# Patient Record
Sex: Male | Born: 1935 | Race: White | Hispanic: No | Marital: Married | Smoking: Never smoker
Health system: Southern US, Community
[De-identification: ages and names within clinical notes are randomized; demographics above are authoritative.]

## PROBLEM LIST (undated history)

## (undated) DIAGNOSIS — H3530 Unspecified macular degeneration: Secondary | ICD-10-CM

## (undated) DIAGNOSIS — R361 Hematospermia: Secondary | ICD-10-CM

## (undated) DIAGNOSIS — N312 Flaccid neuropathic bladder, not elsewhere classified: Secondary | ICD-10-CM

## (undated) DIAGNOSIS — G4700 Insomnia, unspecified: Secondary | ICD-10-CM

## (undated) DIAGNOSIS — N419 Inflammatory disease of prostate, unspecified: Secondary | ICD-10-CM

## (undated) DIAGNOSIS — F419 Anxiety disorder, unspecified: Secondary | ICD-10-CM

## (undated) DIAGNOSIS — N452 Orchitis: Secondary | ICD-10-CM

## (undated) DIAGNOSIS — F4320 Adjustment disorder, unspecified: Secondary | ICD-10-CM

## (undated) DIAGNOSIS — H9319 Tinnitus, unspecified ear: Secondary | ICD-10-CM

## (undated) DIAGNOSIS — K5900 Constipation, unspecified: Secondary | ICD-10-CM

## (undated) DIAGNOSIS — Z87442 Personal history of urinary calculi: Secondary | ICD-10-CM

## (undated) DIAGNOSIS — L80 Vitiligo: Secondary | ICD-10-CM

## (undated) DIAGNOSIS — J45909 Unspecified asthma, uncomplicated: Secondary | ICD-10-CM

## (undated) DIAGNOSIS — N323 Diverticulum of bladder: Secondary | ICD-10-CM

## (undated) DIAGNOSIS — J309 Allergic rhinitis, unspecified: Secondary | ICD-10-CM

## (undated) DIAGNOSIS — E7800 Pure hypercholesterolemia, unspecified: Secondary | ICD-10-CM

## (undated) DIAGNOSIS — I480 Paroxysmal atrial fibrillation: Secondary | ICD-10-CM

## (undated) DIAGNOSIS — K5730 Diverticulosis of large intestine without perforation or abscess without bleeding: Secondary | ICD-10-CM

## (undated) DIAGNOSIS — K649 Unspecified hemorrhoids: Secondary | ICD-10-CM

## (undated) HISTORY — DX: Pure hypercholesterolemia, unspecified: E78.00

## (undated) HISTORY — DX: Tinnitus, unspecified ear: H93.19

## (undated) HISTORY — DX: Unspecified macular degeneration: H35.30

## (undated) HISTORY — DX: Flaccid neuropathic bladder, not elsewhere classified: N31.2

## (undated) HISTORY — DX: Unspecified hemorrhoids: K64.9

## (undated) HISTORY — DX: Vitiligo: L80

## (undated) HISTORY — DX: Personal history of urinary calculi: Z87.442

## (undated) HISTORY — DX: Adjustment disorder, unspecified: F43.20

## (undated) HISTORY — DX: Constipation, unspecified: K59.00

## (undated) HISTORY — DX: Anxiety disorder, unspecified: F41.9

## (undated) HISTORY — DX: Inflammatory disease of prostate, unspecified: N41.9

## (undated) HISTORY — DX: Orchitis: N45.2

## (undated) HISTORY — DX: Diverticulosis of large intestine without perforation or abscess without bleeding: K57.30

## (undated) HISTORY — DX: Unspecified asthma, uncomplicated: J45.909

## (undated) HISTORY — DX: Insomnia, unspecified: G47.00

## (undated) HISTORY — DX: Allergic rhinitis, unspecified: J30.9

## (undated) HISTORY — DX: Hematospermia: R36.1

## (undated) HISTORY — DX: Paroxysmal atrial fibrillation: I48.0

## (undated) HISTORY — DX: Diverticulum of bladder: N32.3

---

## 1998-03-18 ENCOUNTER — Other Ambulatory Visit: Admission: RE | Admit: 1998-03-18 | Discharge: 1998-03-18 | Payer: Self-pay | Admitting: Internal Medicine

## 2000-03-24 ENCOUNTER — Encounter: Payer: Self-pay | Admitting: Internal Medicine

## 2000-03-24 ENCOUNTER — Emergency Department (HOSPITAL_COMMUNITY): Admission: EM | Admit: 2000-03-24 | Discharge: 2000-03-24 | Payer: Self-pay | Admitting: Emergency Medicine

## 2000-03-27 ENCOUNTER — Encounter: Admission: RE | Admit: 2000-03-27 | Discharge: 2000-03-27 | Payer: Self-pay | Admitting: Urology

## 2000-03-27 ENCOUNTER — Encounter: Payer: Self-pay | Admitting: Urology

## 2000-03-30 ENCOUNTER — Encounter: Payer: Self-pay | Admitting: Urology

## 2000-03-30 ENCOUNTER — Ambulatory Visit (HOSPITAL_COMMUNITY): Admission: RE | Admit: 2000-03-30 | Discharge: 2000-03-30 | Payer: Self-pay | Admitting: Urology

## 2000-04-11 ENCOUNTER — Encounter: Admission: RE | Admit: 2000-04-11 | Discharge: 2000-04-11 | Payer: Self-pay | Admitting: Urology

## 2000-04-11 ENCOUNTER — Encounter: Payer: Self-pay | Admitting: Urology

## 2000-04-17 ENCOUNTER — Encounter: Payer: Self-pay | Admitting: Urology

## 2000-04-17 ENCOUNTER — Ambulatory Visit (HOSPITAL_COMMUNITY): Admission: RE | Admit: 2000-04-17 | Discharge: 2000-04-17 | Payer: Self-pay | Admitting: Urology

## 2000-04-26 ENCOUNTER — Encounter: Payer: Self-pay | Admitting: Urology

## 2000-04-26 ENCOUNTER — Encounter: Admission: RE | Admit: 2000-04-26 | Discharge: 2000-04-26 | Payer: Self-pay | Admitting: Urology

## 2000-05-02 ENCOUNTER — Encounter: Payer: Self-pay | Admitting: Urology

## 2000-05-02 ENCOUNTER — Encounter: Admission: RE | Admit: 2000-05-02 | Discharge: 2000-05-02 | Payer: Self-pay | Admitting: Urology

## 2000-05-15 ENCOUNTER — Ambulatory Visit (HOSPITAL_COMMUNITY): Admission: RE | Admit: 2000-05-15 | Discharge: 2000-05-15 | Payer: Self-pay | Admitting: Urology

## 2000-05-22 ENCOUNTER — Encounter: Payer: Self-pay | Admitting: Urology

## 2000-05-22 ENCOUNTER — Encounter: Admission: RE | Admit: 2000-05-22 | Discharge: 2000-05-22 | Payer: Self-pay | Admitting: Urology

## 2001-02-15 ENCOUNTER — Encounter (INDEPENDENT_AMBULATORY_CARE_PROVIDER_SITE_OTHER): Payer: Self-pay | Admitting: Specialist

## 2001-02-15 ENCOUNTER — Inpatient Hospital Stay (HOSPITAL_COMMUNITY): Admission: RE | Admit: 2001-02-15 | Discharge: 2001-02-16 | Payer: Self-pay | Admitting: Urology

## 2002-07-02 ENCOUNTER — Encounter: Payer: Self-pay | Admitting: Interventional Cardiology

## 2002-07-02 ENCOUNTER — Ambulatory Visit (HOSPITAL_COMMUNITY): Admission: RE | Admit: 2002-07-02 | Discharge: 2002-07-02 | Payer: Self-pay | Admitting: Internal Medicine

## 2002-12-05 HISTORY — PX: PROSTATE SURGERY: SHX751

## 2009-02-18 ENCOUNTER — Encounter
Admission: RE | Admit: 2009-02-18 | Discharge: 2009-03-03 | Payer: Self-pay | Admitting: Physical Medicine and Rehabilitation

## 2010-10-04 ENCOUNTER — Ambulatory Visit (HOSPITAL_BASED_OUTPATIENT_CLINIC_OR_DEPARTMENT_OTHER): Admission: RE | Admit: 2010-10-04 | Discharge: 2010-10-05 | Payer: Self-pay | Admitting: Urology

## 2011-02-16 LAB — POCT I-STAT 4, (NA,K, GLUC, HGB,HCT)
Glucose, Bld: 106 mg/dL — ABNORMAL HIGH (ref 70–99)
HCT: 45 % (ref 39.0–52.0)
Hemoglobin: 15.3 g/dL (ref 13.0–17.0)

## 2011-04-22 NOTE — Op Note (Signed)
[facility]  Patient:    Dylan Hartman, Dylan Hartman                        MRN: [id number] Proc. Date: 02/15/01 Attending:  Vonzell Schlatter. Patsi Sears, M.D. CC:         Pearla Dubonnet, M.D.   Operative Report  PREOPERATIVE DIAGNOSES:  Benign prostatic hypertrophy, prostatitis.  POSTOPERATIVE DIAGNOSES:  Benign prostatic hypertrophy, prostatitis, and probable prostatic infarction.  OPERATION PERFORMED:  Cystourethroscopy, bladder neck incision, transurethral resection of prostate.  SURGEON:  Dr. Patsi Sears.  ANESTHESIA:  General (LMA).  PREPARATION:  After appropriate preanesthesia, the patient was brought to the operating room, placed on the operating table in the dorsal supine position where general LMA anesthesia was introduced. He was then replaced in the low Allen stirrup dorsal lithotomy position where the pubis was prepped with Betadine solution and draped in the usual fashion.  DESCRIPTION OF PROCEDURE:  A bladder neck incision was made with the [facility] knife, and transurethral resection of the prostate was made with resection accomplished from the 11 oclock to the 6 oclock position, and from the 1 oclock to the ______ oclock position. It was apparent from the resection that there were areas of prostatic infarction. Resection was accomplished and bleeding was electrocoagulated, and a #24 two-way Ainsworth catheter was left in position. The patient tolerated the procedure well, and was awakened and taken to the recovery room in good condition. DD:  02/15/01 TD:  02/15/01 Job: [id number]

## 2011-04-22 NOTE — Cardiovascular Report (Signed)
NAME:  Dylan Hartman, Dylan Hartman                         ACCOUNT NO.:  [id number]   MEDICAL RECORD NO.:  [id number]                   PATIENT TYPE:  OIB   LOCATION:  2854                                 FACILITY:  [facility]   PHYSICIAN:  Lesleigh Noe, M.D.            DATE OF BIRTH:  1929/11/29   DATE OF PROCEDURE:  DATE OF DISCHARGE:  07/02/2002                              CARDIAC CATHETERIZATION   INDICATIONS FOR PROCEDURE:  Recently performed stress Cardiolite because of  recurring symptoms of chest burning revealed evidence of possible anterior  ischemia and a fixed inferior wall defect.  This study is being done to rule  out evidence of obstructive coronary artery disease.   PROCEDURES PERFORMED:  1. Left heart catheterization.  2. Selective coronary angiography.  3. Left ventriculography.   DESCRIPTION OF PROCEDURE:  After informed consent, a 6-french sheath was  inserted into the right femoral artery using the modified Seldinger  technique.  A 6-French __________ multipurpose catheter was used for  hemodynamic recordings, left and right coronary angiography, and left  ventriculography by hand injection.  The patient tolerated the procedure  without complications.  No complications occurred.   RESULTS:  A. Hemodynamic data:     a. Aortic pressure:  135/73.     b. Left ventricular pressure:  154/14  B. Left ventriculography:  Normal ejection fraction of 60%, no MR.  C. Coronary angiography.     a. Left main coronary:  Normal.     b. Left anterior descending:  The LAD was large and wrapped around the        left ventricular apex.  Gives origin to a large pair of diagonals that        were normal.  Irregularities were noted in the proximal and mid LAD,        but no high-grade obstruction was felt to be present.     c. Circumflex artery:  The circumflex artery gives origin to two obtuse        marginal branches.  The first is a dominant vessel.  The proximal        circumflex  contains irregularities of up to 20%, no high-grade        obstruction was noted.     d. Right coronary artery:  The right coronary artery is dominant.  It is        free of any significant obstruction.  There is a PDA and left        ventricular branch.   CONCLUSIONS:  1. Minimal luminal irregularities noted in the proximal LAD and circumflex.     No high-grade obstruction was noted.  2. Normal LV function.  3.     The patient does not have any evidence of significant coronary artery     disease or any anatomy that would suggest that his chest symptoms are     ischemia/coronary blood flow-related.  PLAN:  No further cardiac evaluation.                                                   Lesleigh Noe, M.D.    HWS/MEDQ  D:  07/02/2002  T:  07/07/2002  Job:  [id number]

## 2011-04-22 NOTE — Consult Note (Signed)
[place]  Patient:    Dylan Hartman, Dylan Hartman                        MRN: [id number] Proc. Date: 03/24/00 Adm. Date:  [id number] Attending:  Lorre Nick Dictator:   [id number]                          Consultation Report  EMERGENCY ROOM  DATE OF BIRTH: 1929/06/24  CHIEF COMPLAINT: Abdominal pain.  HISTORY OF PRESENT ILLNESS: Mr. Corpening is a very pleasant 77 year old male with the following medical problems:  1. History of renal calculus x 1 in the past.  2. Macular degeneration.  3. Allergic rhinitis, seasonal.  4. Vitiligo.  5. Paroxysmal atrial fibrillation.  6. Chronic sinusitis.  The patient awakened suddenly from sleep this morning complaining of a burning ain in his right lower quadrant which then radiated up to his umbilicus.  He had no  nausea or vomiting and no abdominal distention.  No urinary symptoms were noted and no recent change in bowel movements, and no current diarrhea.  The patient denied fever or chills.  There is no testicular pain.  He now states the pain has migrated slightly into his pelvic region anteriorly.  The pain is constant, with some increase or decrease at times.  PAST MEDICAL HISTORY: As above.  CURRENT MEDICATIONS:  1. The patient took several Tums tonight.  2. Augmentin tablet x 1 last p.m.  3. Claritin 10 mg q.d. p.r.n.  4. Lanoxin 0.25 mg q.d.  5. Baby aspirin 1 q.d.  FAMILY HISTORY: Significant for vitiligo and macular degeneration.  SOCIAL HISTORY: The patient is married and his wife is present with him currently. No tobacco or alcohol use.  PHYSICAL EXAMINATION:  GENERAL:  Well-developed, well-nourished male complaining of pain as noted above.  VITAL SIGNS:  Temperature 97.2 degrees, pulse 58 and regular, respiratory rate 0, blood pressure 166/75 in the right arm supine.  HEENT: Decreased vision in both eyes bilaterally.  EOMI.  Oropharynx clear.  NECK: Supple without  JVD.  CHEST: Clear to auscultation.  CARDIAC: Regular rate and rhythm without murmurs, rubs, or gallops.  ABDOMEN: Soft with mild tenderness to deep palpation in the right lower quadrant. No rebound.  Bowel sounds are somewhat decreased but present.  Abdomen nondistended.  No masses felt.  RECTAL: Nontender prostate, non-nodular.  Stool is brown and Hemoccult negative. Good anal sphincter tone.  GU: Genitalia within normal limits.  EXTREMITIES: Without clubbing, cyanosis, or edema.  NEUROLOGIC: Nonfocal.  SKIN: Without significant changes.  LABORATORY DATA: Three-way abdominal film is normal except for increased amount of stool and a 5 x 7 mm renal calculus noted in the mid right ureter.  Urinalysis is yet to be collected as the patient is not urinating as yet, but this will be sent.  CBC revealed a WBC of 5900, hemoglobin 15.8, platelet count 201,000; 55% neutrophils, 30% lymphocytes, 5% eosinophils.  Sodium 137, potassium 3.9, chloride 101, carbon dioxide 28, glucose 105, BUN 20, creatinine 1.1.  Calcium 9.1, total protein 6.8, albumin 3.6, SGOT 34, SGPT 22,  alkaline phosphatase 48, bilirubin 0.4.  MEDICATIONS GIVEN IN EMERGENCY ROOM:  1. Demerol 50 mg IV with 12.5 mg of Phenergan.  2. This was followed by 30 mg IV of Toradol after it was discovered that     he had a renal calculus.  3. Pepcid 20 mg IV.  Of note is that five to ten minutes after giving IV Toradol his pain essentially resolved.  ASSESSMENT: Right ureter renal calculus - with pain resolved after Toradol 30 mg intravenous dose, which followed intravenous Demerol.  PLAN:  1. Strain all urine.  2. Discharge home after his second liter of intravenous fluids is infused and     urinalysis has been sent.  3. Toradol 10 mg p.o. q.8h p.r.n. pain (five doses given by prescription).  4. If pain is not relieved within the next 24 hours or if it acutely worsens     the patient should contact a  physician in our office.  5. He has seen Dr. Etta Grandchild in the past.  He may need a second evaluation if pain     continues more than 24 hours and could need emergent extraction of the     stone, though I feel it is probably passing currently. DD:  03/23/00 TD:  03/24/00 Job: [id number]

## 2011-04-22 NOTE — Discharge Summary (Signed)
[facility]  Patient:    Dylan Hartman, Dylan Hartman                      MRN: [id number] Proc. Date: 02/15/01 Adm. Date:  [id number] Disc. Date: [id number] Attending:  Laqueta Jean                           Discharge Summary  23 Hour observation  PREOPERATIVE DIAGNOSES:  Benign prostatic hypertrophy.  POSTOPERATIVE DIAGNOSES:  Benign prostatic hypertrophy.  OPERATION PERFORMED:  Transurethral resection of prostate by Dr. Patsi Sears under general (LMA) anesthesia.  HISTORY OF PRESENT ILLNESS:  Dylan Hartman is a 77 year old male, treated for chronic prostatitis and bladder outlet obstruction symptoms for transurethral resection of the prostate. The patient has been treated with and failed multiple medications, including alpha blockers and antibiotics. Prostate ultrasound showed a 1.2 cm prostatic cyst, but no evidence of abscess. He is now being admitted via the operating room for TURP.  PAST MEDICAL HISTORY:  Noncontributory except for prostatitis.  REVIEW OF SYSTEMS:  Noncontributory.  ALCOHOL/TOBACCO:  None.  ALLERGIES:  SULFA.  PAST MEDICAL HISTORY: Atrial fibrillation status post cardiolite May 08, 2000, with 68% ejection fraction. He has macular degeneration, is legally blind.  PHYSICAL EXAMINATION:  GENERAL:  Shows a well-developed, well-nourished, white male in no acute distress.  HEENT:  PERRL. EOM full.  NECK:  Supple, nontender, no nodes.  CHEST:  Clear to auscultation and percussion.  ABDOMEN:  Soft, positive bowel sounds without organomegaly or masses.  GENITALIA:  Normal male external genitalia. Testicles descended bilaterally, no abdominal wall hernia. The vas and epididymis were normal. The urethra was normal. The meatus is normal.  RECTAL:  Shows a normal sphincter tone and prostate, 3+ lobular, benign with no blood.  ADMISSION LABORATORY DATA:  Shows EKG with heart rate of 61. Serum creatinine 1.3, BUN 18, hemoglobin  14.2, hematocrit 43.2.  HOSPITAL COURSE:  On the date of admission, the patient underwent TURP. He did quite well, and note that a bladder stone was identified and extracted. The patients had had a bladder stone extracted previously. A Foley catheter was inserted, the patient did well, and was allowed to be discharged on the first postoperative day with Foley catheter in place to be removed in the office. He is allowed to be discharged in stable condition. DD:  02/26/01 TD:  02/26/01 Job: [id number]

## 2011-08-30 ENCOUNTER — Encounter (INDEPENDENT_AMBULATORY_CARE_PROVIDER_SITE_OTHER): Payer: Self-pay | Admitting: Ophthalmology

## 2011-09-16 ENCOUNTER — Encounter (INDEPENDENT_AMBULATORY_CARE_PROVIDER_SITE_OTHER): Payer: Medicare Other | Admitting: Ophthalmology

## 2011-09-16 DIAGNOSIS — H2510 Age-related nuclear cataract, unspecified eye: Secondary | ICD-10-CM

## 2011-09-16 DIAGNOSIS — H3530 Unspecified macular degeneration: Secondary | ICD-10-CM

## 2011-09-16 DIAGNOSIS — H43819 Vitreous degeneration, unspecified eye: Secondary | ICD-10-CM

## 2012-05-05 DIAGNOSIS — N452 Orchitis: Secondary | ICD-10-CM

## 2012-05-05 HISTORY — DX: Orchitis: N45.2

## 2012-09-17 ENCOUNTER — Encounter (INDEPENDENT_AMBULATORY_CARE_PROVIDER_SITE_OTHER): Payer: Medicare Other | Admitting: Ophthalmology

## 2012-09-17 DIAGNOSIS — H2510 Age-related nuclear cataract, unspecified eye: Secondary | ICD-10-CM

## 2012-09-17 DIAGNOSIS — H43819 Vitreous degeneration, unspecified eye: Secondary | ICD-10-CM

## 2012-09-17 DIAGNOSIS — H3530 Unspecified macular degeneration: Secondary | ICD-10-CM

## 2013-09-23 ENCOUNTER — Ambulatory Visit (INDEPENDENT_AMBULATORY_CARE_PROVIDER_SITE_OTHER): Payer: Medicare Other | Admitting: Ophthalmology

## 2013-10-16 ENCOUNTER — Ambulatory Visit (INDEPENDENT_AMBULATORY_CARE_PROVIDER_SITE_OTHER): Payer: Medicare Other | Admitting: Ophthalmology

## 2013-10-16 DIAGNOSIS — H2510 Age-related nuclear cataract, unspecified eye: Secondary | ICD-10-CM

## 2013-10-16 DIAGNOSIS — H43819 Vitreous degeneration, unspecified eye: Secondary | ICD-10-CM

## 2013-10-16 DIAGNOSIS — H35059 Retinal neovascularization, unspecified, unspecified eye: Secondary | ICD-10-CM

## 2013-10-16 DIAGNOSIS — H3530 Unspecified macular degeneration: Secondary | ICD-10-CM

## 2013-11-18 ENCOUNTER — Encounter (INDEPENDENT_AMBULATORY_CARE_PROVIDER_SITE_OTHER): Payer: Medicare Other | Admitting: Ophthalmology

## 2013-11-18 DIAGNOSIS — H35059 Retinal neovascularization, unspecified, unspecified eye: Secondary | ICD-10-CM

## 2013-12-30 ENCOUNTER — Encounter (INDEPENDENT_AMBULATORY_CARE_PROVIDER_SITE_OTHER): Payer: Medicare Other | Admitting: Ophthalmology

## 2013-12-30 DIAGNOSIS — H35059 Retinal neovascularization, unspecified, unspecified eye: Secondary | ICD-10-CM

## 2014-03-03 ENCOUNTER — Encounter (INDEPENDENT_AMBULATORY_CARE_PROVIDER_SITE_OTHER): Payer: Medicare Other | Admitting: Ophthalmology

## 2014-03-03 DIAGNOSIS — H43819 Vitreous degeneration, unspecified eye: Secondary | ICD-10-CM

## 2014-03-03 DIAGNOSIS — H35059 Retinal neovascularization, unspecified, unspecified eye: Secondary | ICD-10-CM

## 2014-03-03 DIAGNOSIS — H3530 Unspecified macular degeneration: Secondary | ICD-10-CM

## 2014-03-03 DIAGNOSIS — H2510 Age-related nuclear cataract, unspecified eye: Secondary | ICD-10-CM

## 2014-06-05 ENCOUNTER — Encounter (INDEPENDENT_AMBULATORY_CARE_PROVIDER_SITE_OTHER): Payer: Medicare Other | Admitting: Ophthalmology

## 2014-06-05 DIAGNOSIS — H3530 Unspecified macular degeneration: Secondary | ICD-10-CM

## 2014-06-05 DIAGNOSIS — H43819 Vitreous degeneration, unspecified eye: Secondary | ICD-10-CM

## 2014-06-05 DIAGNOSIS — H2510 Age-related nuclear cataract, unspecified eye: Secondary | ICD-10-CM

## 2014-09-01 ENCOUNTER — Ambulatory Visit (INDEPENDENT_AMBULATORY_CARE_PROVIDER_SITE_OTHER): Payer: Medicare Other | Admitting: Ophthalmology

## 2014-12-15 ENCOUNTER — Ambulatory Visit (INDEPENDENT_AMBULATORY_CARE_PROVIDER_SITE_OTHER): Payer: Medicare Other | Admitting: Ophthalmology

## 2014-12-15 DIAGNOSIS — H2513 Age-related nuclear cataract, bilateral: Secondary | ICD-10-CM

## 2014-12-15 DIAGNOSIS — H43813 Vitreous degeneration, bilateral: Secondary | ICD-10-CM

## 2014-12-15 DIAGNOSIS — H3531 Nonexudative age-related macular degeneration: Secondary | ICD-10-CM

## 2014-12-31 ENCOUNTER — Encounter: Payer: Self-pay | Admitting: Neurology

## 2015-01-02 ENCOUNTER — Ambulatory Visit (INDEPENDENT_AMBULATORY_CARE_PROVIDER_SITE_OTHER): Payer: Medicare Other | Admitting: Neurology

## 2015-01-02 ENCOUNTER — Encounter: Payer: Self-pay | Admitting: Neurology

## 2015-01-02 VITALS — BP 119/63 | HR 66 | Temp 97.1°F | Ht 72.0 in | Wt 194.0 lb

## 2015-01-02 DIAGNOSIS — G20 Parkinson's disease: Secondary | ICD-10-CM

## 2015-01-02 NOTE — Patient Instructions (Signed)
You have mild parkinsonism. Your exam is reassuring. Overall you are doing fairly well but I do want to suggest a few things today:  Remember to drink plenty of fluid, eat healthy meals and do not skip any meals. Try to eat protein with a every meal and eat a healthy snack such as fruit or nuts in between meals. Try to keep a regular sleep-wake schedule and try to exercise daily, particularly in the form of walking, 20-30 minutes a day, if you can.   Try to stay active physically and mentally. Engage in social activities in your community and with your family and try to keep up with current events by reading the newspaper or watching the news. Try to do word puzzles and you may like to do word puzzles and brain games on the computer such as on [email or web address].   As far as your medications are concerned, I would like to suggest that you take your current medication with the following additional changes: no new medications for now.    I would like to see you back in 4 months, sooner if we need to. Please call us with any interim questions, concerns, problems, updates or refill requests.  Our phone number is [phone]. We also have an after hours call service for urgent matters and there is a physician on-call for urgent questions, that cannot wait till the next work day. For any emergencies you know to call 911 or go to the nearest emergency room.

## 2015-01-02 NOTE — Progress Notes (Signed)
Subjective:    Patient ID: Dylan Hartman is a 81 y.o. male.  HPI     Huston FoleySaima Aaryan Essman, MD, PhD [facility] [address], [address] [address] [place], [place] [address]  Dear Dr. Kevan NyGates,  I saw your patient, Dylan Hartman, upon your kind request in my neurologic clinic today for initial consultation of his parkinsonism. The patient is accompanied by his wife today. As you know, Dylan Hartman is a very pleasant 81 year old right-handed gentleman with an underlying complex medical history of allergic rhinitis, legal blindness secondary to severe macular degeneration, sigmoid diverticulosis, hyperlipidemia, vitiligo, renal stones, insomnia, anxiety, recurrent prostatitis, paroxysmal atrial fibrillation, and BPH, who reports problems with slowness and stiffness over the past year or so. He was noted to have some parkinsonian symptoms by you. You prescribed Sinemet for him which he took with some improvement in his symptoms, however he had GI side effects including abdominal pain and bloating. He is not particularly bothered by his symptoms. He is not impaired in his activities of daily functioning. He has had no recent falls. He does not use an aid to walk. He tries to stay active. He has not had much in the way of memory loss, hallucinations, sleep disorder, personality changes, and reports no tremors.   His Past Medical History Is Significant For: Past Medical History  Diagnosis Date  . Paroxysmal atrial fibrillation   . Adjustment disorder     situational  . Allergic rhinitis   . Degeneration macular     blindness  . Hypercholesterolemia   . Sigmoid diverticulosis   . Vitiligo   . Asthmatic bronchitis     episodic  . H/O renal calculi   . Diverticulum of bladder     Dr Retta Dionesahlstedt  . Hematospermia   . Insomnia   . Anxiety   . Prostatitis     episodic, last episode 12/1999  . Constipation     chronic mild  . Tinnitus   . Hemorrhoids     internal , 12/2009  .  Atonic bladder     in and out  . Orchitis 05/2012    His Past Surgical History Is Significant For: Past Surgical History  Procedure Laterality Date  . Prostate surgery  2004    TUR    His Family History Is Significant For: Family History  Problem Relation Age of Onset  . Stroke Mother   . Prostate cancer Father   . Heart Problems Father   . Macular degeneration Brother     His Social History Is Significant For: History   Social History  . Marital Status: Married    Spouse Name: [name]    Number of Children: 2  . Years of Education: 16   Occupational History  .      retired   Social History Main Topics  . Smoking status: Never Smoker   . Smokeless tobacco: Never Used  . Alcohol Use: No  . Drug Use: No  . Sexual Activity: None   Other Topics Concern  . None   Social History Narrative   Consumes one cup of caffeine daily    His Allergies Are:  Allergies  Allergen Reactions  . Other     Bluefish,tomatoes(large amounts)  . Peanuts [Peanut Oil]   . Sinemet [Carbidopa W-Levodopa]     Upset stomach  . Sulfa Antibiotics   :   His Current Medications Are:  Outpatient Encounter Prescriptions as of 01/02/2015  Medication Sig  . acetaminophen (  TYLENOL) 500 MG tablet Take 500 mg by mouth 2 (two) times daily. Extra strength,2 tablets  Twice daily  . aspirin 81 MG chewable tablet Chew by mouth daily. Two tablets Monday and Friday  . cholecalciferol (VITAMIN D) 1000 UNITS tablet Take 1,000 Units by mouth daily.  . clonazePAM (KLONOPIN) 1 MG tablet   . DIGOX 250 MCG tablet   . hydrocortisone (ANUSOL-HC) 25 MG suppository Place 25 mg rectally 2 (two) times daily.  Marland Kitchen loratadine (CLARITIN) 10 MG tablet Take 10 mg by mouth daily as needed for allergies.  . multivitamin-lutein (OCUVITE-LUTEIN) CAPS capsule Take 1 capsule by mouth 2 (two) times daily.  . Omega-3 Fatty Acids (FISH OIL) 1200 MG CAPS Take by mouth daily.  . Polyethylene Glycol 3350 (MIRALAX PO) Take by mouth.  As directed  . rosuvastatin (CRESTOR) 5 MG tablet Take 5 mg by mouth daily.  . shark liver oil-cocoa butter (PREPARATION H) 0.25-3-85.5 % suppository Place 1 suppository rectally as needed for hemorrhoids.  Marland Kitchen venlafaxine XR (EFFEXOR-XR) 75 MG 24 hr capsule   . [DISCONTINUED] Carbidopa-Levodopa ER (SINEMET CR) 25-100 MG tablet controlled release   :   Review of Systems:  Out of a complete 14 point review of systems, all are reviewed and negative with the exception of these symptoms as listed below:   Review of Systems  Eyes:       Legally blind  Gastrointestinal:       Incontinence, constipation  Neurological:       Memory loss, confusion    Objective:  Neurologic Exam  Physical Exam Physical Examination:   Filed Vitals:   01/02/15 1021  BP: 119/63  Pulse: 66  Temp: 97.1 F (36.2 C)    General Examination: The patient is a very pleasant 81 y.o. male in no acute distress. He appears well-developed and well-nourished and well groomed.   HEENT: Normocephalic, atraumatic, pupils are equal, round and reactive to light and accommodation. Funduscopic exam is difficult. Extraocular tracking is impaired and he has limitation to upgaze, downgaze and side to side gaze. He is visually impaired. He can see moving targets better and has some light perception. Hearing is grossly intact. Tympanic membranes are clear bilaterally. Face is symmetric with mild facial masking and normal facial sensation. Speech is clear with no dysarthria noted.  there may be a mild decrease in blink rate. There is  possibly mild hypophonia. There is no lip, neck/head, jaw or voice tremor. Neck is mildly rigid with full range of passive and active motion. There are no carotid bruits on auscultation. Oropharynx exam reveals: mild mouth dryness, adequate dental hygiene and mild airway crowding. Mallampati is class II. Tongue protrudes centrally and palate elevates symmetrically.    Chest: Clear to auscultation without  wheezing, rhonchi or crackles noted.  Heart: S1+S2+0, regular and normal without murmurs, rubs or gallops noted.   Abdomen: Soft, non-tender and non-distended with normal bowel sounds appreciated on auscultation.  Extremities: There is no pitting edema in the distal lower extremities bilaterally. Pedal pulses are intact.  Skin: Warm and dry without trophic changes noted. There are no varicose veins. Skin is hypopigmented.  Musculoskeletal: exam reveals no obvious joint deformities, tenderness or joint swelling or erythema.   Neurologically:  Mental status: The patient is awake, alert and oriented in all 4 spheres. His immediate and remote memory, attention, language skills and fund of knowledge are appropriate. There is no evidence of aphasia, agnosia, apraxia or anomia. Speech is clear with normal prosody and  enunciation, perhaps mildly hypophonic. Thought process is linear. Mood is normal and affect is normal.  Cranial nerves II - XII are as described above under HEENT exam. In addition: shoulder shrug is normal with equal shoulder height noted. Motor exam: Normal bulk, strength and tone is noted. There is no drift, tremor or rebound. Romberg is negative. Reflexes are 1+ throughout. Babinski: Toes are flexor bilaterally. Fine motor skills and coordination: He has mild fine motor difficulties in the bilateral upper and lower extremities with foot taps, foot agility, finger taps, rapid alternating movements and hand movements. There is no lateralization and findings are overall mild.   Cerebellar testing: No dysmetria or intention tremor on finger to nose testing. Heel to shin is unremarkable bilaterally. There is no truncal or gait ataxia.  Sensory exam: intact to light touch, pinprick, vibration, temperature sense in the upper and lower extremities.  Gait, station and balance: He stands with mild difficulty. Posture is mildly stooped but could be age-appropriate. Stance is slightly wide-based.  Gait shows mild decrease in stride length and pace and decrease in arm swing bilaterally. He turns in 3 steps and balance seems mildly impaired.               Assessment and Plan:   In summary, CONNELLY NETTERVILLE is a very pleasant 81 y.o.-year old male with an underlying complex medical history of allergic rhinitis, legal blindness secondary to severe macular degeneration, sigmoid diverticulosis, hyperlipidemia, vitiligo, renal stones, insomnia, anxiety, recurrent prostatitis, paroxysmal atrial fibrillation, and BPH, who and physical exam are in keeping with mild parkinsonism. He does not have on exam much in the way of tremors or lateralization of symptoms or signs. Overall findings are rather mild at this time. Thankfully he is still active and not impaired in his daily living and his functioning. His memory is age-appropriate, and at this time I talked to the patient and his wife about the diagnosis of parkinsonism and the fact that clinically he does not have telltale signs and symptoms of idiopathic Parkinson's disease at this time. He has tried Sinemet with side effects reported and at this time is not keen on trying any other medications. We will follow him clinically. Otherwise is neurological in general exam are nonfocal so we will hold off on any other testing at this time. They were in agreement. To that end, I would like to see him back in a few months for recheck and we will pick up our discussion about his symptoms and signs at the time and potential symptomatic treatment.  We talked about maintaining a healthy lifestyle in general. I encouraged the patient to eat healthy, exercise daily and keep well hydrated, to keep a scheduled bedtime and wake time routine, to not skip any meals and eat healthy snacks in between meals and to have protein with every meal. In particular, I stressed the importance of regular exercise, within of course the patient's own mobility limitations. I spent 45 min in total  face-to-face time with the patient, more 50% of which was spent in counseling and coordination of care, reviewing test results, reviewing medication and reviewing the diagnosis of parkinsonism, its prognosis and treatment options.  Thank you very much for allowing me to participate in the care of this nice patient. If I can be of any further assistance to you please do not hesitate to call me at [phone].  Sincerely,   Huston Foley, MD, PhD

## 2015-04-28 ENCOUNTER — Encounter: Payer: Self-pay | Admitting: Neurology

## 2015-04-28 ENCOUNTER — Ambulatory Visit (INDEPENDENT_AMBULATORY_CARE_PROVIDER_SITE_OTHER): Payer: Medicare Other | Admitting: Neurology

## 2015-04-28 VITALS — BP 124/60 | HR 60 | Resp 14 | Ht 72.0 in | Wt 192.0 lb

## 2015-04-28 DIAGNOSIS — G20 Parkinson's disease: Secondary | ICD-10-CM

## 2015-04-28 DIAGNOSIS — H540 Blindness, both eyes: Secondary | ICD-10-CM | POA: Diagnosis not present

## 2015-04-28 DIAGNOSIS — H543 Unqualified visual loss, both eyes: Secondary | ICD-10-CM

## 2015-04-28 NOTE — Progress Notes (Signed)
Subjective:    Patient ID: Dylan Hartman is a 81 y.o. male.  HPI     Interim history:   Dylan Hartman is a very pleasant 81 year old right-handed gentleman with an underlying complex medical history of allergic rhinitis, legal blindness secondary to severe macular degeneration, sigmoid diverticulosis, hyperlipidemia, vitiligo, renal stones, insomnia, anxiety, recurrent prostatitis, paroxysmal atrial fibrillation, and BPH, who presents for follow-up consultation of his parkinsonism. The patient is accompanied by his wife again today. I first met him on 01/02/2015 at the request of his primary care physician, at which time the patient reported in over one year history of slowness and stiffness. I felt he had mild parkinsonism but not much in the way of lateralization. He had stopped taking his Sinemet due to side effects. I suggested clinically monitoring him and rechecking in a few months as far as his symptoms and clinical exam.  Today, 04/28/2015: He reports feeling stable. He has not fallen recently. He and his wife reside at [facility] in the independent living. He likes to have dessert every day. He may not be drinking enough water. He averages about 4 glasses per day they estimate. His wife feels that his memory is not as good. He's forgetful. He sometimes misplaces things. She feels that sometimes he gets confused. She is not overly concerned but has noted some changes in his cognitive function. They have 2 grown daughters, 56 yo (in [place]) and 80 yo (local). They have 2 great grandchildren and one on the way. They go to the exercise room at [facility] about 3 times a week. They do chair yoga. He walks some but not daily.  Previously:  He was noted to have parkinsonism by his primary care physician, who started him on Sinemet. He noted some improvement in his symptoms, however he had GI side effects including abdominal pain and bloating. He is not particularly bothered by his symptoms. He is  not impaired in his activities of daily functioning. He has had no recent falls. He does not use an aid to walk. He tries to stay active. He has not had much in the way of memory loss, hallucinations, sleep disorder, personality changes, and reports no tremors.  His Past Medical History Is Significant For: Past Medical History  Diagnosis Date  . Paroxysmal atrial fibrillation   . Adjustment disorder     situational  . Allergic rhinitis   . Degeneration macular     blindness  . Hypercholesterolemia   . Sigmoid diverticulosis   . Vitiligo   . Asthmatic bronchitis     episodic  . H/O renal calculi   . Diverticulum of bladder     Dr Diona Fanti  . Hematospermia   . Insomnia   . Anxiety   . Prostatitis     episodic, last episode 12/1999  . Constipation     chronic mild  . Tinnitus   . Hemorrhoids     internal , 12/2009  . Atonic bladder     in and out  . Orchitis 05/2012  . Macular degeneration     His Past Surgical History Is Significant For: Past Surgical History  Procedure Laterality Date  . Prostate surgery  2004    [facility]    His Family History Is Significant For: Family History  Problem Relation Age of Onset  . Stroke Mother   . Prostate cancer Father   . Heart Problems Father   . Macular degeneration Brother     His Social History  Is Significant For: History   Social History  . Marital Status: Married    Spouse Name: Clarise Cruz  . Number of Children: 2  . Years of Education: 16   Occupational History  . Reired      retired   Social History Main Topics  . Smoking status: Never Smoker   . Smokeless tobacco: Never Used  . Alcohol Use: No  . Drug Use: No  . Sexual Activity: Not on file   Other Topics Concern  . None   Social History Narrative   Consumes one cup of caffeine daily    His Allergies Are:  Allergies  Allergen Reactions  . Other     Bluefish,tomatoes(large amounts)  . Peanuts [Peanut Oil]   . Sinemet [Carbidopa W-Levodopa]     Upset  stomach  . Sulfa Antibiotics   :   His Current Medications Are:  Outpatient Encounter Prescriptions as of 04/28/2015  Medication Sig  . acetaminophen (TYLENOL) 500 MG tablet Take 500 mg by mouth 2 (two) times daily. Extra strength,2 tablets  Twice daily  . aspirin 81 MG chewable tablet Chew by mouth daily. Two tablets Monday and Friday  . cholecalciferol (VITAMIN D) 1000 UNITS tablet Take 1,000 Units by mouth daily.  . clonazePAM (KLONOPIN) 1 MG tablet   . CRESTOR 20 MG tablet   . DIGOX 250 MCG tablet   . loratadine (CLARITIN) 10 MG tablet Take 10 mg by mouth daily as needed for allergies.  . multivitamin-lutein (OCUVITE-LUTEIN) CAPS capsule Take 1 capsule by mouth 2 (two) times daily.  . Omega-3 Fatty Acids (FISH OIL) 1200 MG CAPS Take by mouth daily.  . Polyethylene Glycol 3350 (MIRALAX PO) Take by mouth. As directed  . venlafaxine XR (EFFEXOR-XR) 75 MG 24 hr capsule   . [DISCONTINUED] hydrocortisone (ANUSOL-HC) 25 MG suppository Place 25 mg rectally 2 (two) times daily.  . [DISCONTINUED] rosuvastatin (CRESTOR) 5 MG tablet Take 5 mg by mouth daily.  . [DISCONTINUED] shark liver oil-cocoa butter (PREPARATION H) 0.25-3-85.5 % suppository Place 1 suppository rectally as needed for hemorrhoids.   No facility-administered encounter medications on file as of 04/28/2015.  :  Review of Systems:  Out of a complete 14 point review of systems, all are reviewed and negative with the exception of these symptoms as listed below:   Review of Systems  All other systems reviewed and are negative.   Objective:  Neurologic Exam  Physical Exam Physical Examination:   Filed Vitals:   04/28/15 1037  BP: 124/60  Pulse: 60  Resp: 14   General Examination: The patient is a very pleasant 81 y.o. male in no acute distress. He appears well-developed and well-nourished and well groomed.   HEENT: Normocephalic, atraumatic, pupils are equal, round and reactive to light and accommodation. Funduscopic  exam is difficult. Extraocular tracking is impaired and he has limitation to upgaze, downgaze and side to side gaze. His left eye does not move as well. He is visually impaired with absence of central vision secondary to macular degeneration. He can see moving targets better and has some light perception. Hearing is grossly intact. Face is symmetric with mild facial masking and normal facial sensation. Speech is clear with no dysarthria noted.  there may be a mild decrease in blink rate. There is  possibly mild hypophonia. There is no lip, neck/head, jaw or voice tremor. Neck is mildly rigid with full range of passive and active motion. There are no carotid bruits on auscultation. Oropharynx exam reveals: mild  mouth dryness, adequate dental hygiene and mild airway crowding. Mallampati is class II. Tongue protrudes centrally and palate elevates symmetrically.    Chest: Clear to auscultation without wheezing, rhonchi or crackles noted.  Heart: S1+S2+0, regular and normal without murmurs, rubs or gallops noted.   Abdomen: Soft, non-tender and non-distended with normal bowel sounds appreciated on auscultation.  Extremities: There is no pitting edema in the distal lower extremities bilaterally. Pedal pulses are intact.  Skin: Warm and dry without trophic changes noted. There are no varicose veins. Skin is hypopigmented.  Musculoskeletal: exam reveals no obvious joint deformities, tenderness or joint swelling or erythema.   Neurologically:  Mental status: The patient is awake, alert and oriented in all 4 spheres. His immediate and remote memory, attention, language skills and fund of knowledge are appropriate. There is no evidence of aphasia, agnosia, apraxia or anomia. Speech is clear with normal prosody and enunciation, perhaps mildly hypophonic. Thought process is linear. Mood is normal and affect is normal.  Cranial nerves II - XII are as described above under HEENT exam. In addition: shoulder shrug is  normal with equal shoulder height noted. Motor exam: Normal bulk, strength and tone is noted. There is no drift, tremor or rebound. Romberg is negative. Reflexes are 1+ throughout. Babinski: Toes are flexor bilaterally. Fine motor skills and coordination: He has mild fine motor difficulties in the bilateral upper and lower extremities with foot taps, foot agility, finger taps, rapid alternating movements and hand movements. There is no lateralization and findings are overall mild.   Cerebellar testing: No dysmetria or intention tremor on finger to nose testing. Heel to shin is unremarkable bilaterally. There is no truncal or gait ataxia.  Sensory exam: intact to light touch, pinprick, vibration, temperature sense in the upper and lower extremities.  Gait, station and balance: He stands with mild difficulty. Posture is mildly stooped but could be age-appropriate, unchanged. Stance is slightly wide-based. Gait shows mild decrease in stride length and pace and mild decrease in arm swing bilaterally. He turns in 3 steps and balance seems mildly impaired.               Assessment and Plan:   In summary, Dylan Hartman is a very pleasant 81 year old male with an underlying complex medical history of allergic rhinitis, legal blindness secondary to severe macular degeneration, sigmoid diverticulosis, hyperlipidemia, vitiligo, renal stones, insomnia, anxiety, recurrent prostatitis, paroxysmal atrial fibrillation, and BPH, who presents for follow-up consultation of his mild parkinsonism. Again, his exam does not show any overt lateralization. Symptoms and physical exam are overall fairly stable. We will monitor his symptoms and his exam. His wife reports some memory loss. We will continue to monitor. This could be age-appropriate memory loss. He tried Sinemet in the recent past with side effects and no significant improvement. He is currently not on any antiparkinsonian medication and I suggested we continue to  follow him clinically.  Thankfully he is still active and not impaired in his daily living and his functioning. He has not fallen. He is advised to consider using a cane for safety, especially since he is visually impaired.  We again talked about maintaining a healthy lifestyle in general. I encouraged the patient to eat healthy, exercise daily and keep well hydrated, to keep a scheduled bedtime and wake time routine, to not skip any meals and eat healthy snacks in between meals and to have protein with every meal. In particular, I stressed the importance of regular exercise, within of  course the patient's own mobility limitations. I encouraged him to drink more water, approximately 6 glasses a day and I encouraged him to walk on a daily basis if possible. At this juncture, I will see him back in 6 months, sooner if the need arises. I answered all her questions today and the patient and his wife were in agreement. I spent 20 minutes in total face-to-face time with the patient, more than 50% of which was spent in counseling and coordination of care, reviewing test results, reviewing medication and discussing or reviewing the diagnosis of parkinsonism, the prognosis and treatment options.

## 2015-04-28 NOTE — Patient Instructions (Signed)
Please drink more water, around 6 glasses a day. Try to walk on a daily basis, 15-20 minutes daily.  Use a cane for safety as needed.  I will see you back in 6 months for a re-check.

## 2015-10-07 ENCOUNTER — Telehealth: Payer: Self-pay

## 2015-10-07 ENCOUNTER — Encounter: Payer: Self-pay | Admitting: Nurse Practitioner

## 2015-10-07 ENCOUNTER — Ambulatory Visit (INDEPENDENT_AMBULATORY_CARE_PROVIDER_SITE_OTHER): Payer: Medicare Other | Admitting: Nurse Practitioner

## 2015-10-07 ENCOUNTER — Ambulatory Visit: Payer: Medicare Other | Admitting: Neurology

## 2015-10-07 VITALS — BP 120/69 | HR 60 | Ht 73.0 in | Wt 190.4 lb

## 2015-10-07 DIAGNOSIS — G20 Parkinson's disease: Secondary | ICD-10-CM | POA: Diagnosis not present

## 2015-10-07 DIAGNOSIS — H543 Unqualified visual loss, both eyes: Secondary | ICD-10-CM

## 2015-10-07 DIAGNOSIS — H540 Blindness, both eyes: Secondary | ICD-10-CM

## 2015-10-07 DIAGNOSIS — G20A1 Parkinson's disease without dyskinesia, without mention of fluctuations: Secondary | ICD-10-CM | POA: Insufficient documentation

## 2015-10-07 NOTE — Telephone Encounter (Signed)
I spoke to wife to reschedule appt due to doctor out sick. She wanted to still come in today and see Eber Jonesarolyn NP and also make an appt with Dr. Frances FurbishAthar later this month.

## 2015-10-07 NOTE — Patient Instructions (Signed)
Keep follow up appointment with Dr. Frances FurbishAthar on the 15th She may want to try some medications for parkinsons Discussed swallowing techniques

## 2015-10-07 NOTE — Progress Notes (Addendum)
[facility]  PATIENT: Dylan Hartman DOB: 08/23/1929   REASON FOR VISIT: Follow-up for Parkinsons symptoms, blindness in both eyes, tremors HISTORY FROM: Wife and patient    HISTORY OF PRESENT ILLNESS: HISTORY:Dylan Hartman is a very pleasant 81 year old right-handed gentleman with an underlying complex medical history of allergic rhinitis, legal blindness secondary to severe macular degeneration, sigmoid diverticulosis, hyperlipidemia, vitiligo, renal stones, insomnia, anxiety, recurrent prostatitis, paroxysmal atrial fibrillation, and BPH, who presents for follow-up consultation of his parkinsonism. The patient is accompanied by his wife again today. I first met him on 01/02/2015 at the request of his primary care physician, at which time the patient reported in over one year history of slowness and stiffness. I felt he had mild parkinsonism but not much in the way of lateralization. He had stopped taking his Sinemet due to side effects. I suggested clinically monitoring him and rechecking in a few months as far as his symptoms and clinical exam.  Today, 04/28/2015: He reports feeling stable. He has not fallen recently. He and his wife reside at [facility] in the independent living. He likes to have dessert every day. He may not be drinking enough water. He averages about 4 glasses per day they estimate. His wife feels that his memory is not as good. He's forgetful. He sometimes misplaces things. She feels that sometimes he gets confused. She is not overly concerned but has noted some changes in his cognitive function. They have 2 grown daughters, 64 yo (in [place]) and 21 yo (local). They have 2 great grandchildren and one on the way. They go to the exercise room at [facility] about 3 times a week. They do chair yoga. He walks some but not daily.  UPDATE 10/07/2015. Dylan Hartman returns for follow-up with his wife. He has previously been seen by Dr. Rexene Alberts who is out of the  office today. Patient's biggest complaint now is difficulty swallowing particularly pills. He denies any recent falls. He reports that his memory is doing well however his wife reports some  memory loss. This could be age-appropriate. He has a scheduled appointment with Dr. Rexene Alberts in 2 weeks.   REVIEW OF SYSTEMS: Full 14 system review of systems performed and notable only for those listed, all others are neg:  Constitutional: Fatigue Cardiovascular: neg Ear/Nose/Throat: neg  Skin: neg Eyes: Loss of vision Respiratory: neg Gastroitestinal: neg  Hematology/Lymphatic: neg  Endocrine: Intolerance to cold Musculoskeletal: Walking difficulty Allergy/Immunology: neg Neurological: Memory loss, speech difficulty Psychiatric: Decreased concentration Sleep : neg   ALLERGIES: Allergies  Allergen Reactions  . Other     Bluefish,tomatoes(large amounts)  . Peanuts [Peanut Oil]   . Sinemet [Carbidopa W-Levodopa]     Upset stomach  . Sulfa Antibiotics     HOME MEDICATIONS: Outpatient Prescriptions Prior to Visit  Medication Sig Dispense Refill  . acetaminophen (TYLENOL) 500 MG tablet Take 500 mg by mouth 2 (two) times daily. Extra strength,2 tablets  Twice daily    . aspirin 81 MG chewable tablet Chew by mouth daily. Two tablets Monday and Friday    . cholecalciferol (VITAMIN D) 1000 UNITS tablet Take 1,000 Units by mouth daily.    . clonazePAM (KLONOPIN) 1 MG tablet   1  . CRESTOR 20 MG tablet Take 5 mg by mouth. Taking MWF  1  . DIGOX 250 MCG tablet   0  . loratadine (CLARITIN) 10 MG tablet Take 10 mg by mouth daily as needed for allergies.    . multivitamin-lutein (  OCUVITE-LUTEIN) CAPS capsule Take 1 capsule by mouth 2 (two) times daily.    . Omega-3 Fatty Acids (FISH OIL) 1200 MG CAPS Take by mouth daily.    . Polyethylene Glycol 3350 (MIRALAX PO) Take by mouth. As directed    . venlafaxine XR (EFFEXOR-XR) 75 MG 24 hr capsule   2   No facility-administered medications prior to visit.      PAST MEDICAL HISTORY: Past Medical History  Diagnosis Date  . Paroxysmal atrial fibrillation (HCC)   . Adjustment disorder     situational  . Allergic rhinitis   . Degeneration macular     blindness  . Hypercholesterolemia   . Sigmoid diverticulosis   . Vitiligo   . Asthmatic bronchitis     episodic  . H/O renal calculi   . Diverticulum of bladder     Dr Diona Fanti  . Hematospermia   . Insomnia   . Anxiety   . Prostatitis     episodic, last episode 12/1999  . Constipation     chronic mild  . Tinnitus   . Hemorrhoids     internal , 12/2009  . Atonic bladder     in and out  . Orchitis 05/2012  . Macular degeneration     PAST SURGICAL HISTORY: Past Surgical History  Procedure Laterality Date  . Prostate surgery  2004    [facility]    FAMILY HISTORY: Family History  Problem Relation Age of Onset  . Stroke Mother   . Prostate cancer Father   . Heart Problems Father   . Macular degeneration Brother     SOCIAL HISTORY: Social History   Social History  . Marital Status: Married    Spouse Name: Clarise Cruz  . Number of Children: 2  . Years of Education: 16   Occupational History  . Reired      retired   Social History Main Topics  . Smoking status: Never Smoker   . Smokeless tobacco: Never Used  . Alcohol Use: No  . Drug Use: No  . Sexual Activity: Not on file   Other Topics Concern  . Not on file   Social History Narrative   Consumes one cup of caffeine daily   Pt is blind (legally).  Has macular degeneration.     Lives at [facility] (independent living).     PHYSICAL EXAM  Filed Vitals:   10/07/15 1021  BP: 120/69  Pulse: 60  Height: _0  (1.854 m)  Weight: 190 lb 6.4 oz (86.365 kg)   Body mass index is 25.13 kg/(m^2).  Generalized: Well developed, in no acute distress,  well groomed Head: normocephalic and atraumatic,. Oropharynx benign  Neck: Mildly rigid,  no carotid bruits  Cardiac: Regular rate rhythm, no murmur  Musculoskeletal:  No deformity   Neurological examination   Mentation: Alert oriented to time, place, history taking. Attention span and concentration appropriate. Recent and remote memory intact.  Follows all commands speech is hypophonic and language fluent.   Cranial nerve II-XII: Fundoscopic exam not done. He is visually impaired with absence of central vision due to macular degeneration. He has some light perception, extraocular tracking is impaired and he has limitation to upgaze and downgaze and side-to-side gaze  Facial sensation and strength were normal ,he has mild facial masking. Hearing was intact to finger rubbing bilaterally. Uvula tongue midline. head turning and shoulder shrug were normal and symmetric.Tongue protrusion into cheek strength was normal. Motor: normal bulk and tone, full strength in the BUE,  BLE, fine finger movements impaired with foot taps finger taps and rapid alternating movements . No tremor. Mild cogwheeling at the left wrist and elbow. Sensory: normal and symmetric to light touch, pinprick, and  Vibration, proprioception  Coordination: finger-nose-finger, heel-to-shin bilaterally, no dysmetria Reflexes: Brachioradialis 1/1, biceps 1/1, triceps 1/1, patellar 1/1, Achilles 1/1, plantar responses were flexor bilaterally. Gait and Station: Rising up from seated position with push off, stooped posture and stance is mildly wide based. He ambulated 70 feet in the hall bilateral decreased arm swing. No difficulty with turning DIAGNOSTIC DATA (LABS, IMAGING, TESTING) -ASSESSMENT AND PLAN  81 y.o. year old male  has a past medical history of  and Macular degeneration. and mild symptoms of Parkinson's here to follow-up. He is now complaining of some swallowing difficulty.The patient is a current patient of Dr. Rexene Alberts  who is out of the office today . This note is sent to the work in doctor.     Keep follow up appointment with Dr. Rexene Alberts on the 15th She may want to try some medications for  parkinsons Discussed swallowing techniques , important to swallow several times before taking next bite of food. Your voice is mildly hypophonic the same muscles for swallowing are also for speaking. Drooling may occur due to inability to swallow saliva.It is important to slow down with your eating he reports he is a fast eater. Watch for any significant weight loss due to swallowing issues. If problems persist with swallowing you can have a swallowing evaluation.  Stay well-hydrated 6-8 glasses of water daily Try to walk on a daily basis at least 30 minutes, to prevent falls using a cane for safety I spent 25 minutes. Total face-to-face with the patient and his wife, more than 50% of which spent in counseling and care coordination, discussing and reviewing the diagnosis of Parkinson's and treatment options. Also discussed symptom management.Vst time [place], [facility], [facility], APRN  [facility] [address], [place], [place] [address] [phone]  Personally examined patient and images, and have participated in and made any corrections needed to history, physical, neuro exam,assessment and plan as stated above.  Sarina Ill, MD [facility]

## 2015-10-20 ENCOUNTER — Ambulatory Visit (INDEPENDENT_AMBULATORY_CARE_PROVIDER_SITE_OTHER): Payer: Medicare Other | Admitting: Neurology

## 2015-10-20 ENCOUNTER — Encounter: Payer: Self-pay | Admitting: Neurology

## 2015-10-20 VITALS — BP 132/70 | HR 68 | Resp 16 | Ht 73.0 in | Wt 192.0 lb

## 2015-10-20 DIAGNOSIS — H540 Blindness, both eyes: Secondary | ICD-10-CM | POA: Diagnosis not present

## 2015-10-20 DIAGNOSIS — G20 Parkinson's disease: Secondary | ICD-10-CM

## 2015-10-20 DIAGNOSIS — H543 Unqualified visual loss, both eyes: Secondary | ICD-10-CM

## 2015-10-20 NOTE — Patient Instructions (Signed)
Let's monitor symptoms for about 4 months and reconsider a low dose of Sinemet (generic name: carbidopa-levodopa) 25/100 mg, such as half a pill twice daily, up to 1/2 pill 3 times a day.  Try to be more physically and mentally active.

## 2015-10-20 NOTE — Progress Notes (Signed)
Subjective:    Patient ID: Dylan Hartman is a 81 y.o. male.  HPI     Interim history:   Dylan Hartman is a very pleasant 81 year old right-handed gentleman with an underlying complex medical history of allergic rhinitis, legal blindness secondary to severe macular degeneration, sigmoid diverticulosis, hyperlipidemia, vitiligo, renal stones, insomnia, anxiety, recurrent prostatitis, paroxysmal atrial fibrillation, and BPH, who presents for follow-up consultation of his parkinsonism. The patient is accompanied by his wife again today. I last saw him on 04/28/2015, at which time he reported feeling stable. He had no recent falls. He and his wife reside at [facility] in independent living. He likes to have dessert every day. He does not always drinking enough water, averaging about 4 glasses per day. His wife felt that his memory was not as good and he was more forgetful, misplacing things. She felt that at times he was even confused.  They have 2 grown daughters, 33 yo (in [place]) and 28 yo (local). They have 2 great grandchildren and one on the way. They go to the exercise room at [facility] about 3 times a week. They do chair yoga. He walks some but not daily. I suggested we follow him clinically. He had tried Sinemet with side effects noted. He had not felt any significant improvement with that. I asked him to stay better hydrated and use a cane for safety, especially because of his visual impairment. In the interim, he was seen by Cecille Rubin, nurse practitioner on 10/07/2015, at which time he reported problems swallowing large pills. He was advised to stay active physically and drink enough water. He was advised to start using a cane for gait safety.  Today, 10/20/2015: He reports feeling about the same, no recent falls, no change in mood, but some memory loss. Does not do enough physical activity. He does not like to listen to the radio. He cannot read because of his macular degeneration and  declined using audiobooks per wife. She feels that he is not enough challenged cognitively.  Previously:  I first met him on 01/02/2015 at the request of his primary care physician, at which time the patient reported in over one year history of slowness and stiffness. I felt he had mild parkinsonism but not much in the way of lateralization. He had stopped taking his Sinemet due to side effects. I suggested clinically monitoring him and rechecking in a few months as far as his symptoms and clinical exam.  He was noted to have parkinsonism by his primary care physician, who started him on Sinemet. He noted some improvement in his symptoms, however he had GI side effects including abdominal pain and bloating. He is not particularly bothered by his symptoms. He is not impaired in his activities of daily functioning. He has had no recent falls. He does not use an aid to walk. He tries to stay active. He has not had much in the way of memory loss, hallucinations, sleep disorder, personality changes, and reports no tremors.  His Past Medical History Is Significant For: Past Medical History  Diagnosis Date  . Paroxysmal atrial fibrillation (HCC)   . Adjustment disorder     situational  . Allergic rhinitis   . Degeneration macular     blindness  . Hypercholesterolemia   . Sigmoid diverticulosis   . Vitiligo   . Asthmatic bronchitis     episodic  . H/O renal calculi   . Diverticulum of bladder     Dr Diona Fanti  .  Hematospermia   . Insomnia   . Anxiety   . Prostatitis     episodic, last episode 12/1999  . Constipation     chronic mild  . Tinnitus   . Hemorrhoids     internal , 12/2009  . Atonic bladder     in and out  . Orchitis 05/2012  . Macular degeneration     His Past Surgical History Is Significant For: Past Surgical History  Procedure Laterality Date  . Prostate surgery  2004    [facility]    His Family History Is Significant For: Family History  Problem Relation Age of Onset   . Stroke Mother   . Prostate cancer Father   . Heart Problems Father   . Macular degeneration Brother     His Social History Is Significant For: Social History   Social History  . Marital Status: Married    Spouse Name: Dylan Hartman  . Number of Children: 2  . Years of Education: 16   Occupational History  . Reired      retired   Social History Main Topics  . Smoking status: Never Smoker   . Smokeless tobacco: Never Used  . Alcohol Use: No  . Drug Use: No  . Sexual Activity: Not Asked   Other Topics Concern  . None   Social History Narrative   Consumes one cup of caffeine daily   Pt is blind (legally).  Has macular degeneration.     Lives at [facility] (independent living).    His Allergies Are:  Allergies  Allergen Reactions  . Other     Bluefish,tomatoes(large amounts)  . Sinemet [Carbidopa W-Levodopa]     cramps  . Sulfa Antibiotics   :   His Current Medications Are:  Outpatient Encounter Prescriptions as of 10/20/2015  Medication Sig  . acetaminophen (TYLENOL) 500 MG tablet Take 500 mg by mouth 2 (two) times daily. Extra strength,2 tablets  Twice daily  . aspirin 81 MG chewable tablet Chew by mouth daily. Two tablets Monday and Friday  . cholecalciferol (VITAMIN D) 1000 UNITS tablet Take 1,000 Units by mouth daily.  . clonazePAM (KLONOPIN) 1 MG tablet   . CRESTOR 20 MG tablet Take 5 mg by mouth. Taking MWF  . DIGOX 250 MCG tablet   . loratadine (CLARITIN) 10 MG tablet Take 10 mg by mouth daily as needed for allergies.  . multivitamin-lutein (OCUVITE-LUTEIN) CAPS capsule Take 1 capsule by mouth 2 (two) times daily.  . Omega-3 Fatty Acids (FISH OIL) 1200 MG CAPS Take by mouth daily.  . Polyethylene Glycol 3350 (MIRALAX PO) Take by mouth. As directed  . venlafaxine XR (EFFEXOR-XR) 75 MG 24 hr capsule    No facility-administered encounter medications on file as of 10/20/2015.  :  Review of Systems:  Out of a complete 14 point review of systems, all are  reviewed and negative with the exception of these symptoms as listed below:   Review of Systems  Neurological:       No new concerns. Per wife, they are following up to see if Dr. Rexene Alberts would like to start any new medications.     Objective:  Neurologic Exam  Physical Exam Physical Examination:   Filed Vitals:   10/20/15 1510  BP: 132/70  Pulse: 68  Resp: 16   General Examination: The patient is a very pleasant 81 y.o. male in no acute distress. He appears well-developed and well-nourished and well groomed. He is in good spirits today.  HEENT:  Normocephalic, atraumatic, pupils are equal, round and reactive to light and accommodation. Extraocular tracking is impaired and he has limitation to upgaze, downgaze and side to side gaze. His left eye does not move as well. He is visually impaired with absence of central vision secondary to macular degeneration b/l. He can see moving targets better and has some light perception. Hearing is grossly intact. Face is symmetric with mild facial masking and normal facial sensation. Speech is clear with no dysarthria noted. There may be a mild decrease in blink rate. There is mild hypophonia. There is no lip, neck/head, jaw or voice tremor. Neck is mildly rigid with full range of passive and active motion. There are no carotid bruits on auscultation. Oropharynx exam reveals: mild mouth dryness, adequate dental hygiene and mild airway crowding. Mallampati is class II. Tongue protrudes centrally and palate elevates symmetrically.    Chest: Clear to auscultation without wheezing, rhonchi or crackles noted.  Heart: S1+S2+0, regular and normal without murmurs, rubs or gallops noted.   Abdomen: Soft, non-tender and non-distended with normal bowel sounds appreciated on auscultation.  Extremities: There is no pitting edema in the distal lower extremities bilaterally. Pedal pulses are intact.  Skin: Warm and dry without trophic changes noted. There are no  varicose veins. Skin is hypopigmented.  Musculoskeletal: exam reveals no obvious joint deformities, tenderness or joint swelling or erythema.   Neurologically:  Mental status: The patient is awake, alert and oriented in all 4 spheres. His immediate and remote memory, attention, language skills and fund of knowledge are appropriate. There is no evidence of aphasia, agnosia, apraxia or anomia. Speech is clear with normal prosody and enunciation, perhaps mildly hypophonic. Thought process is linear. Mood is normal and affect is normal.  Cranial nerves II - XII are as described above under HEENT exam. In addition: shoulder shrug is normal with equal shoulder height noted. Motor exam: Normal bulk, strength and tone is noted. There is no drift, tremor or rebound. Romberg is negative. Reflexes are 1+ throughout. Fine motor skills and coordination: He has mild fine motor difficulties in the bilateral upper and lower extremities with foot taps, foot agility, finger taps, rapid alternating movements and hand movements. There is no lateralization and findings are overall mild.   Cerebellar testing: No dysmetria or intention tremor on finger to nose testing. Heel to shin is unremarkable bilaterally. There is no truncal or gait ataxia.  Sensory exam: intact to light touch in the upper and lower extremities.  Gait, station and balance: He stands with mild difficulty. Posture is mild to moderately stooped but could be age-appropriate, unchanged. Stance is slightly wide-based. Gait shows mild decrease in stride length and pace and mild decrease in arm swing bilaterally. He turns in 3 steps and balance seems mildly impaired.               Assessment and Plan:   In summary, Dylan Hartman is a very pleasant 81 year old male with an underlying complex medical history of allergic rhinitis, legal blindness secondary to severe macular degeneration, sigmoid diverticulosis, hyperlipidemia, vitiligo, renal stones, insomnia,  anxiety, recurrent prostatitis, paroxysmal atrial fibrillation, and BPH, who presents for follow-up consultation of his mild parkinsonism. Again, his exam does not show any overt lateralization and exam has remained fairly stable in the last 6 months. We mutually agreed to monitor his symptoms. He has mild memory loss. He is advised to try to be more active mentally and physically. He is trying to eat healthy and  drink enough water. They have a gym available right across from where they live at their independent living facility. We could potentially try low-dose Sinemet 25-100 milligrams strength half a pill twice daily in the near future. He tried Sinemet in the recent past with side effects and no significant improvement. He is currently not on any antiparkinsonian medication and I suggested we continue to follow him clinically with a check up in 4 months.  Thankfully he is still active and not impaired in his daily living and his functioning. He has not fallen. He is advised to consider using a cane for safety, especially since he is visually impaired.  We again talked about maintaining a healthy lifestyle in general. I encouraged the patient to eat healthy, exercise daily and keep well hydrated, to keep a scheduled bedtime and wake time routine, to not skip any meals and eat healthy snacks in between meals and to have protein with every meal. In particular, I stressed the importance of regular exercise, within of course the patient's own mobility limitations. I encouraged him to drink more water, approximately 6 glasses a day and I encouraged him to walk on a daily basis if possible. At this juncture, I will see him back in 4 months, sooner if the need arises. I answered all her questions today and the patient and his wife were in agreement. I spent 20 minutes in total face-to-face time with the patient, more than 50% of which was spent in counseling and coordination of care, reviewing test results,  reviewing medication and discussing or reviewing the diagnosis of parkinsonism, the prognosis and treatment options.

## 2015-11-03 ENCOUNTER — Ambulatory Visit: Payer: Medicare Other | Admitting: Neurology

## 2015-12-21 ENCOUNTER — Ambulatory Visit (INDEPENDENT_AMBULATORY_CARE_PROVIDER_SITE_OTHER): Payer: Medicare Other | Admitting: Ophthalmology

## 2016-01-07 ENCOUNTER — Ambulatory Visit (INDEPENDENT_AMBULATORY_CARE_PROVIDER_SITE_OTHER): Payer: PPO | Admitting: Ophthalmology

## 2016-01-07 DIAGNOSIS — H43813 Vitreous degeneration, bilateral: Secondary | ICD-10-CM

## 2016-01-07 DIAGNOSIS — H2513 Age-related nuclear cataract, bilateral: Secondary | ICD-10-CM

## 2016-01-07 DIAGNOSIS — H353231 Exudative age-related macular degeneration, bilateral, with active choroidal neovascularization: Secondary | ICD-10-CM

## 2016-02-18 ENCOUNTER — Ambulatory Visit (INDEPENDENT_AMBULATORY_CARE_PROVIDER_SITE_OTHER): Payer: PPO | Admitting: Neurology

## 2016-02-18 ENCOUNTER — Telehealth: Payer: Self-pay | Admitting: Neurology

## 2016-02-18 ENCOUNTER — Encounter: Payer: Self-pay | Admitting: Neurology

## 2016-02-18 VITALS — BP 122/58 | HR 78 | Resp 18 | Ht 73.0 in | Wt 184.0 lb

## 2016-02-18 DIAGNOSIS — H540 Blindness, both eyes: Secondary | ICD-10-CM

## 2016-02-18 DIAGNOSIS — R413 Other amnesia: Secondary | ICD-10-CM

## 2016-02-18 DIAGNOSIS — R498 Other voice and resonance disorders: Secondary | ICD-10-CM

## 2016-02-18 DIAGNOSIS — G20 Parkinson's disease: Secondary | ICD-10-CM | POA: Diagnosis not present

## 2016-02-18 DIAGNOSIS — H543 Unqualified visual loss, both eyes: Secondary | ICD-10-CM

## 2016-02-18 DIAGNOSIS — G20C Parkinsonism, unspecified: Secondary | ICD-10-CM

## 2016-02-18 MED ORDER — CARBIDOPA-LEVODOPA 25-100 MG PO TABS: ORAL_TABLET | ORAL | Status: DC

## 2016-02-18 NOTE — Progress Notes (Signed)
Subjective:    Patient ID: Dylan Hartman is a 82 y.o. male.  HPI     Interim history:   Dylan Hartman is a very pleasant 82 year-old right-handed gentleman with an underlying complex medical history of allergic rhinitis, legal blindness secondary to severe macular degeneration, sigmoid diverticulosis, hyperlipidemia, vitiligo, renal stones, insomnia, anxiety, recurrent prostatitis, paroxysmal atrial fibrillation, and BPH, who presents for follow-up consultation of his parkinsonism. The patient is accompanied by his wife again today. I last saw him on 10/20/2015, at which time he reported feeling about the same, no recent falls or change in mood but did have some memory loss. He was not physically active enough. He was not able to read because of his macular degeneration but was also declining audiobooks per wife. She felt that he was not challenged enough cognitively and was not interested enough and even listening to the radio.: He reports feeling about the same, no recent falls, no change in mood, but some memory loss. Does not do enough physical activity. He does not like to listen to the radio. He cannot read because of his macular degeneration and declined using audiobooks per wife. She feels that he is not enough challenged cognitively. He was not on Parkinson's medication at the time. We mutually agreed to follow his symptoms clinically.  Today, 02/18/2016: He reports having had more difficulty getting in and out of chairs and getting out of the car. Thankfully, he has not fallen recently. He has unchanged visual symptoms. He has had no significant memory decline but has mild memory loss and mild hearing loss. He has been stable with his mood. It takes him longer to get dressed and his wife also noted that he is slower in his gait and sometimes shuffles his feet more. In the past when he was tried on Sinemet he had bloating and nausea and discomfort but no actual allergic reaction even though the  medication is now listed under his allergies. They have physical therapy and speech therapy at their retirement community. His appetite is good but he does not always drink enough water.  Previously:  I saw him on 04/28/2015, at which time he reported feeling stable. He had no recent falls. He and his wife reside at [facility] in independent living. He likes to have dessert every day. He does not always drinking enough water, averaging about 4 glasses per day. His wife felt that his memory was not as good and he was more forgetful, misplacing things. She felt that at times he was even confused.   They have 2 grown daughters, 69 yo (in [place]) and 85 yo (local). They have 2 great grandchildren and one on the way. They go to the exercise room at [facility] about 3 times a week. They do chair yoga. He walks some but not daily. I suggested we follow him clinically. He had tried Sinemet with side effects noted. He had not felt any significant improvement with that. I asked him to stay better hydrated and use a cane for safety, especially because of his visual impairment.  In the interim, he was seen by Dylan Hartman, nurse practitioner on 10/07/2015, at which time he reported problems swallowing large pills. He was advised to stay active physically and drink enough water. He was advised to start using a cane for gait safety.  I first met him on 01/02/2015 at the request of his primary care physician, at which time the patient reported in over one year history  of slowness and stiffness. I felt he had mild parkinsonism but not much in the way of lateralization. He had stopped taking his Sinemet due to side effects. I suggested clinically monitoring him and rechecking in a few months as far as his symptoms and clinical exam.  He was noted to have parkinsonism by his primary care physician, who started him on Sinemet. He noted some improvement in his symptoms, however he had GI side effects including abdominal  pain and bloating. He is not particularly bothered by his symptoms. He is not impaired in his activities of daily functioning. He has had no recent falls. He does not use an aid to walk. He tries to stay active. He has not had much in the way of memory loss, hallucinations, sleep disorder, personality changes, and reports no tremors.   His Past Medical History Is Significant For: Past Medical History  Diagnosis Date  . Paroxysmal atrial fibrillation (HCC)   . Adjustment disorder     situational  . Allergic rhinitis   . Degeneration macular     blindness  . Hypercholesterolemia   . Sigmoid diverticulosis   . Vitiligo   . Asthmatic bronchitis     episodic  . H/O renal calculi   . Diverticulum of bladder     Dr Diona Fanti  . Hematospermia   . Insomnia   . Anxiety   . Prostatitis     episodic, last episode 12/1999  . Constipation     chronic mild  . Tinnitus   . Hemorrhoids     internal , 12/2009  . Atonic bladder     in and out  . Orchitis 05/2012  . Macular degeneration     His Past Surgical History Is Significant For: Past Surgical History  Procedure Laterality Date  . Prostate surgery  2004    [facility]    His Family History Is Significant For: Family History  Problem Relation Age of Onset  . Stroke Mother   . Prostate cancer Father   . Heart Problems Father   . Macular degeneration Brother     His Social History Is Significant For: Social History   Social History  . Marital Status: Married    Spouse Name: Dylan Hartman  . Number of Children: 2  . Years of Education: 16   Occupational History  . Reired      retired   Social History Main Topics  . Smoking status: Never Smoker   . Smokeless tobacco: Never Used  . Alcohol Use: No  . Drug Use: No  . Sexual Activity: Not Asked   Other Topics Concern  . None   Social History Narrative   Consumes one cup of caffeine daily   Pt is blind (legally).  Has macular degeneration.     Lives at [facility] (independent  living).    His Allergies Are:  Allergies  Allergen Reactions  . Other     Bluefish,tomatoes(large amounts)  . Sinemet [Carbidopa W-Levodopa]     cramps  . Sulfa Antibiotics   :   His Current Medications Are:  Outpatient Encounter Prescriptions as of 02/18/2016  Medication Sig  . acetaminophen (TYLENOL) 500 MG tablet Take 500 mg by mouth 2 (two) times daily. Extra strength,2 tablets  Twice daily  . aspirin 81 MG chewable tablet Chew 81 mg by mouth daily.   . cholecalciferol (VITAMIN D) 1000 UNITS tablet Take 1,000 Units by mouth daily.  . clonazePAM (KLONOPIN) 1 MG tablet Take 1 mg by mouth  daily.   Marland Kitchen [place] 250 MCG tablet Take 5 weekly  . loratadine (CLARITIN) 10 MG tablet Take 10 mg by mouth daily as needed for allergies.  . multivitamin-lutein (OCUVITE-LUTEIN) CAPS capsule Take 1 capsule by mouth 2 (two) times daily.  . Omega-3 Fatty Acids (FISH OIL) 1200 MG CAPS Take by mouth 2 (two) times daily.   . rosuvastatin (CRESTOR) 5 MG tablet   . venlafaxine XR (EFFEXOR-XR) 75 MG 24 hr capsule Take 75 mg by mouth daily with breakfast.   . [DISCONTINUED] CRESTOR 20 MG tablet Take 5 mg by mouth. Taking MWF  . [DISCONTINUED] Polyethylene Glycol 3350 (MIRALAX PO) Take by mouth. As directed   No facility-administered encounter medications on file as of 02/18/2016.  :  Review of Systems:  Out of a complete 14 point review of systems, all are reviewed and negative with the exception of these symptoms as listed below:   Review of Systems  Neurological:       Wife reports that she has a couple of questions about her husband. No new problems.     Objective:  Neurologic Exam  Physical Exam Physical Examination:   Filed Vitals:   02/18/16 1307  BP: 122/58  Pulse: 78  Resp: 18   General Examination: The patient is a very pleasant 82 y.o. male in no acute distress. He appears well-developed and well-nourished and well groomed. He is in good spirits today.  HEENT: Normocephalic,  atraumatic, pupils are equal, round and reactive to light and accommodation. Extraocular tracking is impaired and he has limitation to upgaze, downgaze and side to side gaze. His left eye does not move as well. He is visually impaired with absence of central vision secondary to macular degeneration b/l. He can see moving targets better and has some light perception. Hearing is mildly impaired. Face is symmetric with mild facial masking and normal facial sensation. Speech is clear with no dysarthria noted. There may be a mild decrease in blink rate. There Moderate hypophonia, there is no drooling. He has no lip, neck or jaw tremor. He does have mild neck rigidity with fairly intact range of motion. There are no carotid bruits on auscultation. Oropharynx exam reveals: mild mouth dryness, adequate dental hygiene and mild airway crowding. Mallampati is class II. Tongue protrudes centrally and palate elevates symmetrically.    Chest: Clear to auscultation without wheezing, rhonchi or crackles noted.  Heart: S1+S2+0, regular and normal without murmurs, rubs or gallops noted.   Abdomen: Soft, non-tender and non-distended with normal bowel sounds appreciated on auscultation.  Extremities: There is no pitting edema in the distal lower extremities bilaterally. Pedal pulses are intact.  Skin: Warm and dry without trophic changes noted. There are no varicose veins. Skin is hypopigmented.  Musculoskeletal: exam reveals no obvious joint deformities, tenderness or joint swelling or erythema.   Neurologically:  Mental status: The patient is awake, alert and oriented in all 4 spheres. His immediate and remote memory, attention, language skills and fund of knowledge are fairly appropriate but he does look to his wife for some of the answers and she will answer for him after delay or if he only answers briefly. He does have more significant hypophonia today. Thought process is linear. Mood is normal and affect is  normal.  Cranial nerves II - XII are as described above under HEENT exam. In addition: shoulder shrug is normal with equal shoulder height noted. Motor exam: Normal bulk, strength and tone is noted for age. There is no  drift, tremor or rebound.he has no telltale resting tremor. Fine motor skills with finger taps and rapid alternating patting and foot agility and foot taps are mild to moderately impaired on the right and mildly impaired on the left. There is some lateralization today to the right. He has no past pointing. Sensory exam is intact to light touch bilaterally, Romberg is negative. Reflexes are 1+ throughout.   Cerebellar testing: No dysmetria or intention tremor on finger to nose testing. Heel to shin is unremarkable bilaterally. There is no truncal or gait ataxia.  Sensory exam: intact to light touch in the upper and lower extremities.  Gait, station and balance: He stands with mild difficulty And has to push himself up from the chair. Posture is mild to moderately stooped, perhaps a little worse than last time or stable. He is slightly wide-based. Gait shows mild decrease in stride length and pace and decrease in arm swing bilaterally, right side more than left. He turns slowly. Balance is mildly impaired.               Assessment and Plan:   In summary, RAYVON DAKIN is a very pleasant 82 year old male with an underlying complex medical history of allergic rhinitis, legal blindness secondary to severe macular degeneration, sigmoid diverticulosis, hyperlipidemia, vitiligo, renal stones, insomnia, anxiety, recurrent prostatitis, paroxysmal atrial fibrillation, and BPH, who presents for follow-up consultation of his mild parkinsonism. He has had some progression in his symptoms and his exam is slightly worse from last time. He in fact has a little bit of lateralization to the right. He has no overt resting tremor. We have previously monitored his symptoms for the past year but today we  mutually agreed to try him again on low-dose Sinemet, half a pill twice daily for 1 week, he can go 2 weeks if needed and then we will go up to half a pill 3 times a day. I provided him with a new prescription which hopefully the medication will be delivered to [facility] and written separate instructions. In addition, I suggested outpatient physical therapy and speech therapy which hopefully they can have a [place] as well because there are therapists on site. I would like to see him back in 3 months, sooner if the need arises. Hopefully with a more slow were approached his Sinemet titration he can tolerate this. He has never had an allergic reaction to this but was not able to tolerate this secondary to GI side effects.  I encouraged his wife to call with any interim questions or concerns. He is advised to continue to stay active physically and mentally and drink enough water.  I answered all their questions today and the patient and his wife were in agreement. I spent 25 minutes in total face-to-face time with the patient, more than 50% of which was spent in counseling and coordination of care, reviewing test results, reviewing medication and discussing or reviewing the diagnosis of parkinsonism, the prognosis and treatment options.

## 2016-02-18 NOTE — Patient Instructions (Addendum)
Let's try you again on low dose Sinemet (generic name: carbidopa-levodopa) 25/100 mg: Take half a pill twice daily (8 AM and noon) for one week, then half a pill 3 times a day (8 AM, noon, and 4 PM) thereafter. Please try to take the medication away from you mealtimes, that is, ideally either one hour before or 2 hours after your meal to ensure optimal absorption. The medication can interfere with the protein content of your meal and trying to the protein in your food and therefore not get fully absorbed.  Common side effects reported are: Nausea, vomiting, sedation, confusion, lightheadedness. Rare side effects include hallucinations, severe nausea or vomiting, diarrhea and significant drop in blood pressure especially when going from lying to standing or from sitting to standing.

## 2016-02-18 NOTE — Telephone Encounter (Signed)
Spouse called to advise, they [facility], states [facility].

## 2016-02-18 NOTE — Telephone Encounter (Signed)
Noted  

## 2016-02-23 DIAGNOSIS — R498 Other voice and resonance disorders: Secondary | ICD-10-CM | POA: Diagnosis not present

## 2016-02-23 DIAGNOSIS — G20 Parkinson's disease: Secondary | ICD-10-CM | POA: Diagnosis not present

## 2016-02-23 DIAGNOSIS — M6281 Muscle weakness (generalized): Secondary | ICD-10-CM | POA: Diagnosis not present

## 2016-02-23 DIAGNOSIS — R471 Dysarthria and anarthria: Secondary | ICD-10-CM | POA: Diagnosis not present

## 2016-02-23 DIAGNOSIS — R2689 Other abnormalities of gait and mobility: Secondary | ICD-10-CM | POA: Diagnosis not present

## 2016-02-25 DIAGNOSIS — R2689 Other abnormalities of gait and mobility: Secondary | ICD-10-CM | POA: Diagnosis not present

## 2016-02-25 DIAGNOSIS — M6281 Muscle weakness (generalized): Secondary | ICD-10-CM | POA: Diagnosis not present

## 2016-02-25 DIAGNOSIS — G20 Parkinson's disease: Secondary | ICD-10-CM | POA: Diagnosis not present

## 2016-02-26 DIAGNOSIS — R471 Dysarthria and anarthria: Secondary | ICD-10-CM | POA: Diagnosis not present

## 2016-02-26 DIAGNOSIS — G20 Parkinson's disease: Secondary | ICD-10-CM | POA: Diagnosis not present

## 2016-02-26 DIAGNOSIS — R498 Other voice and resonance disorders: Secondary | ICD-10-CM | POA: Diagnosis not present

## 2016-02-29 DIAGNOSIS — G20 Parkinson's disease: Secondary | ICD-10-CM | POA: Diagnosis not present

## 2016-02-29 DIAGNOSIS — R2689 Other abnormalities of gait and mobility: Secondary | ICD-10-CM | POA: Diagnosis not present

## 2016-02-29 DIAGNOSIS — M6281 Muscle weakness (generalized): Secondary | ICD-10-CM | POA: Diagnosis not present

## 2016-03-01 DIAGNOSIS — R498 Other voice and resonance disorders: Secondary | ICD-10-CM | POA: Diagnosis not present

## 2016-03-01 DIAGNOSIS — R2689 Other abnormalities of gait and mobility: Secondary | ICD-10-CM | POA: Diagnosis not present

## 2016-03-01 DIAGNOSIS — M6281 Muscle weakness (generalized): Secondary | ICD-10-CM | POA: Diagnosis not present

## 2016-03-01 DIAGNOSIS — G20 Parkinson's disease: Secondary | ICD-10-CM | POA: Diagnosis not present

## 2016-03-01 DIAGNOSIS — R471 Dysarthria and anarthria: Secondary | ICD-10-CM | POA: Diagnosis not present

## 2016-03-03 DIAGNOSIS — R471 Dysarthria and anarthria: Secondary | ICD-10-CM | POA: Diagnosis not present

## 2016-03-03 DIAGNOSIS — M6281 Muscle weakness (generalized): Secondary | ICD-10-CM | POA: Diagnosis not present

## 2016-03-03 DIAGNOSIS — R2689 Other abnormalities of gait and mobility: Secondary | ICD-10-CM | POA: Diagnosis not present

## 2016-03-03 DIAGNOSIS — G20 Parkinson's disease: Secondary | ICD-10-CM | POA: Diagnosis not present

## 2016-03-03 DIAGNOSIS — R498 Other voice and resonance disorders: Secondary | ICD-10-CM | POA: Diagnosis not present

## 2016-03-07 DIAGNOSIS — G20 Parkinson's disease: Secondary | ICD-10-CM | POA: Diagnosis not present

## 2016-03-07 DIAGNOSIS — R2689 Other abnormalities of gait and mobility: Secondary | ICD-10-CM | POA: Diagnosis not present

## 2016-03-07 DIAGNOSIS — M6281 Muscle weakness (generalized): Secondary | ICD-10-CM | POA: Diagnosis not present

## 2016-03-08 DIAGNOSIS — G20 Parkinson's disease: Secondary | ICD-10-CM | POA: Diagnosis not present

## 2016-03-08 DIAGNOSIS — R498 Other voice and resonance disorders: Secondary | ICD-10-CM | POA: Diagnosis not present

## 2016-03-08 DIAGNOSIS — R2689 Other abnormalities of gait and mobility: Secondary | ICD-10-CM | POA: Diagnosis not present

## 2016-03-08 DIAGNOSIS — M6281 Muscle weakness (generalized): Secondary | ICD-10-CM | POA: Diagnosis not present

## 2016-03-08 DIAGNOSIS — R471 Dysarthria and anarthria: Secondary | ICD-10-CM | POA: Diagnosis not present

## 2016-03-10 DIAGNOSIS — G20 Parkinson's disease: Secondary | ICD-10-CM | POA: Diagnosis not present

## 2016-03-10 DIAGNOSIS — R2689 Other abnormalities of gait and mobility: Secondary | ICD-10-CM | POA: Diagnosis not present

## 2016-03-10 DIAGNOSIS — M6281 Muscle weakness (generalized): Secondary | ICD-10-CM | POA: Diagnosis not present

## 2016-03-10 DIAGNOSIS — R498 Other voice and resonance disorders: Secondary | ICD-10-CM | POA: Diagnosis not present

## 2016-03-10 DIAGNOSIS — R471 Dysarthria and anarthria: Secondary | ICD-10-CM | POA: Diagnosis not present

## 2016-03-14 ENCOUNTER — Telehealth: Payer: Self-pay | Admitting: Neurology

## 2016-03-14 NOTE — Telephone Encounter (Signed)
Called wife back. Relayed Dr Dohmeiers recommendations. Has appointment tomorrow with PCP. She has r/s. She will call back if they have further questions/concerns.

## 2016-03-14 NOTE — Telephone Encounter (Signed)
Called wife back. She states per Dr Frances FurbishAthar notes, they 1/2 tab 2/day for a week Last 1 1/2 tab last week daily. She noticed diarrhea started when she increased dose. Started on 03/02/16. Has been separating from food. Has pain and goes about a tablespoon.  Had appt today with PA but could not go because of diarrhea. Dr Jenne PaneBates was booked and they could not see him. Wife also stated he has a rash on chest. Wife noticed today. He is legally blind. She is not sure how long it has been there. No new laundry detergent or soap to use in shower. He does have hay fever in spring. He takes an antihistamine (claritin) seasonally. He has not started this yet.   Spoke to Dr Vickey Hugerohmeier. She would like him to stop medication for 3 days and see if sx improve. He can take immodium OTC for diarrhea. Must try and r/s to see PCP in the meantime.

## 2016-03-14 NOTE — Telephone Encounter (Signed)
Wife called to advise carbidopa-levodopa (SINEMET IR) 25-100 MG tablet "is causing diarrhea, should patient stop medication, cut back on the medication", please advise.

## 2016-03-15 DIAGNOSIS — R159 Full incontinence of feces: Secondary | ICD-10-CM | POA: Diagnosis not present

## 2016-03-15 DIAGNOSIS — K591 Functional diarrhea: Secondary | ICD-10-CM | POA: Diagnosis not present

## 2016-03-21 DIAGNOSIS — G20 Parkinson's disease: Secondary | ICD-10-CM | POA: Diagnosis not present

## 2016-03-21 DIAGNOSIS — R2689 Other abnormalities of gait and mobility: Secondary | ICD-10-CM | POA: Diagnosis not present

## 2016-03-21 DIAGNOSIS — M6281 Muscle weakness (generalized): Secondary | ICD-10-CM | POA: Diagnosis not present

## 2016-03-21 NOTE — Telephone Encounter (Signed)
Pt's wife called sts the gastric problems have subsided but his overall condition has gotten worse since stopping the medication. He is experiencing slowed speech, he's having increased difficulty getting out of the chair and not picking up his feet-shuffling them some. She said he had improved before the dose was decreased. He is finishing up PT and speech therapy this week. She is wanting to start it back as he was doing so well, maybe keep him at a lower dose she said.

## 2016-03-21 NOTE — Telephone Encounter (Signed)
He can restart sinemet 25/100 mg, at 1/2 pill 3 times a day, if tolerated, please advise pt or wife. thx

## 2016-03-22 DIAGNOSIS — R498 Other voice and resonance disorders: Secondary | ICD-10-CM | POA: Diagnosis not present

## 2016-03-22 DIAGNOSIS — G20 Parkinson's disease: Secondary | ICD-10-CM | POA: Diagnosis not present

## 2016-03-22 DIAGNOSIS — R471 Dysarthria and anarthria: Secondary | ICD-10-CM | POA: Diagnosis not present

## 2016-03-23 NOTE — Telephone Encounter (Signed)
FYI: I spoke to wife, she is aware of advice below. They will restart C/L but is reluctant to take it 3 times a day. Wife states that they would like to start out taking 1/2 tab twice a day and see how he does.

## 2016-03-23 NOTE — Telephone Encounter (Signed)
Wife called, states she hasn't heard from anyone since leaving message on Monday 4/17. Please call [phone], [facility], will be unavailable between 10:45am-11:45am.

## 2016-03-24 DIAGNOSIS — G20 Parkinson's disease: Secondary | ICD-10-CM | POA: Diagnosis not present

## 2016-03-24 DIAGNOSIS — R471 Dysarthria and anarthria: Secondary | ICD-10-CM | POA: Diagnosis not present

## 2016-03-24 DIAGNOSIS — R498 Other voice and resonance disorders: Secondary | ICD-10-CM | POA: Diagnosis not present

## 2016-04-06 ENCOUNTER — Encounter: Payer: Self-pay | Admitting: Neurology

## 2016-04-06 ENCOUNTER — Ambulatory Visit (INDEPENDENT_AMBULATORY_CARE_PROVIDER_SITE_OTHER): Payer: PPO | Admitting: Neurology

## 2016-04-06 VITALS — BP 128/62 | HR 76 | Resp 16 | Ht 73.0 in | Wt 186.0 lb

## 2016-04-06 DIAGNOSIS — G20 Parkinson's disease: Secondary | ICD-10-CM | POA: Diagnosis not present

## 2016-04-06 DIAGNOSIS — H540 Blindness, both eyes: Secondary | ICD-10-CM

## 2016-04-06 DIAGNOSIS — H543 Unqualified visual loss, both eyes: Secondary | ICD-10-CM

## 2016-04-06 DIAGNOSIS — R498 Other voice and resonance disorders: Secondary | ICD-10-CM | POA: Diagnosis not present

## 2016-04-06 NOTE — Progress Notes (Signed)
Subjective:    Patient ID: Dylan Hartman is a 82 y.o. male.  HPI     Interim history:   Dylan Hartman is a very pleasant 82 year-old right-handed gentleman with an underlying complex medical history of allergic rhinitis, legal blindness secondary to severe macular degeneration, sigmoid diverticulosis, hyperlipidemia, vitiligo, renal stones, insomnia, anxiety, recurrent prostatitis, paroxysmal atrial fibrillation, and BPH, who presents for follow-up consultation of his parkinsonism. The patient is accompanied by his wife again today and presents for a sooner than scheduled appointment because of problems with his medication. I last saw him on 02/18/2016, at which time he reported having more difficulty getting in and out of chairs and getting out of the car. Thankfully, he had not fallen recently. He had unchanged visual symptoms. He had no significant memory decline but has mild memory loss and mild hearing loss. He was stable with his mood. His wife had noticed that he was more slower and that he would shuffle his feet and not pick up his feet as well. He had tried Sinemet in the past but had bloating and nausea with it. We mutually agreed to try again. I started him on Sinemet half a pill twice daily with gradual titration. His wife called in the interim reporting that he was not able to tolerate the medication secondary to diarrhea and abdominal pain. She did note that he had improved gait and mobility on the medication. She felt he was able to tolerate only half pill twice daily. She was advised to continue at the low dose.  Today, 04/06/2016: He reports feeling okay. He was in physical therapy and speech therapy which helped. He did his therapy at [facility]. He had some issues with loose stool and bowel incontinence when he was on the Sinemet. He had some abdominal pain. His wife believes that he was able to tolerated up to maybe half a pill twice a day. She thinks that he did better motor-wise  when he was on it. He would be willing to try it again. He does not always drink enough water. Appetite is good. Weight has been stable. Blood pressure is good as well.  Previously:  I saw him on 10/20/2015, at which time he reported feeling about the same, no recent falls or change in mood but did have some memory loss. He was not physically active enough. He was not able to read because of his macular degeneration but was also declining audiobooks per wife. She felt that he was not challenged enough cognitively and was not interested enough and even listening to the radio.: He reports feeling about the same, no recent falls, no change in mood, but some memory loss. Does not do enough physical activity. He does not like to listen to the radio. He cannot read because of his macular degeneration and declined using audiobooks per wife. She feels that he is not enough challenged cognitively. He was not on Parkinson's medication at the time. We mutually agreed to follow his symptoms clinically.  I saw him on 04/28/2015, at which time he reported feeling stable. He had no recent falls. He and his wife reside at [facility] in independent living. He likes to have dessert every day. He does not always drinking enough water, averaging about 4 glasses per day. His wife felt that his memory was not as good and he was more forgetful, misplacing things. She felt that at times he was even confused.   They have 2 grown daughters, 29  yo (in [place]) and 11 yo (local). They have 2 great grandchildren and one on the way. They go to the exercise room at [facility] about 3 times a week. They do chair yoga. He walks some but not daily. I suggested we follow him clinically. He had tried Sinemet with side effects noted. He had not felt any significant improvement with that. I asked him to stay better hydrated and use a cane for safety, especially because of his visual impairment.  In the interim, he was seen by Dylan Hartman,  nurse practitioner on 10/07/2015, at which time he reported problems swallowing large pills. He was advised to stay active physically and drink enough water. He was advised to start using a cane for gait safety.  I first met him on 01/02/2015 at the request of his primary care physician, at which time the patient reported in over one year history of slowness and stiffness. I felt he had mild parkinsonism but not much in the way of lateralization. He had stopped taking his Sinemet due to side effects. I suggested clinically monitoring him and rechecking in a few months as far as his symptoms and clinical exam.  He was noted to have parkinsonism by his primary care physician, who started him on Sinemet. He noted some improvement in his symptoms, however he had GI side effects including abdominal pain and bloating. He is not particularly bothered by his symptoms. He is not impaired in his activities of daily functioning. He has had no recent falls. He does not use an aid to walk. He tries to stay active. He has not had much in the way of memory loss, hallucinations, sleep disorder, personality changes, and reports no tremors.   His Past Medical History Is Significant For: Past Medical History  Diagnosis Date  . Paroxysmal atrial fibrillation (HCC)   . Adjustment disorder     situational  . Allergic rhinitis   . Degeneration macular     blindness  . Hypercholesterolemia   . Sigmoid diverticulosis   . Vitiligo   . Asthmatic bronchitis     episodic  . H/O renal calculi   . Diverticulum of bladder     Dr Diona Fanti  . Hematospermia   . Insomnia   . Anxiety   . Prostatitis     episodic, last episode 12/1999  . Constipation     chronic mild  . Tinnitus   . Hemorrhoids     internal , 12/2009  . Atonic bladder     in and out  . Orchitis 05/2012  . Macular degeneration     His Past Surgical History Is Significant For: Past Surgical History  Procedure Laterality Date  . Prostate surgery   2004    [facility]    His Family History Is Significant For: Family History  Problem Relation Age of Onset  . Stroke Mother   . Prostate cancer Father   . Heart Problems Father   . Macular degeneration Brother     His Social History Is Significant For: Social History   Social History  . Marital Status: Married    Spouse Name: Dylan Hartman  . Number of Children: 2  . Years of Education: 16   Occupational History  . Reired      retired   Social History Main Topics  . Smoking status: Never Smoker   . Smokeless tobacco: Never Used  . Alcohol Use: No  . Drug Use: No  . Sexual Activity: Not Asked  Other Topics Concern  . None   Social History Narrative   Consumes one cup of caffeine daily   Pt is blind (legally).  Has macular degeneration.     Lives at [facility] (independent living).    His Allergies Are:  Allergies  Allergen Reactions  . Other     Bluefish,tomatoes(large amounts)  . Sinemet [Carbidopa W-Levodopa]     cramps  . Sulfa Antibiotics   :   His Current Medications Are:  Outpatient Encounter Prescriptions as of 04/06/2016  Medication Sig  . acetaminophen (TYLENOL) 500 MG tablet Take 500 mg by mouth as needed. Extra strength,2 tablets  Twice daily  . aspirin 81 MG tablet Take 81 mg by mouth daily.  . cholecalciferol (VITAMIN D) 1000 UNITS tablet Take 1,000 Units by mouth daily.  . clonazePAM (KLONOPIN) 1 MG tablet Take 1 mg by mouth daily.   Marland Kitchen Dylan Hartman 250 MCG tablet Take 5 weekly  . loratadine (CLARITIN) 10 MG tablet Take 10 mg by mouth daily as needed for allergies.  . multivitamin-lutein (OCUVITE-LUTEIN) CAPS capsule Take 1 capsule by mouth 2 (two) times daily.  . Omega-3 Fatty Acids (FISH OIL) 1200 MG CAPS Take by mouth 2 (two) times daily.   . rosuvastatin (CRESTOR) 5 MG tablet   . venlafaxine XR (EFFEXOR-XR) 75 MG 24 hr capsule Take 75 mg by mouth daily with breakfast.   . [DISCONTINUED] aspirin 81 MG chewable tablet Chew 81 mg by mouth daily.   .  carbidopa-levodopa (SINEMET IR) 25-100 MG tablet Take 1/2 pill twice daily x 1 week, then 1/2 pill 3 times a day thereafter. (Patient not taking: Reported on 04/06/2016)   No facility-administered encounter medications on file as of 04/06/2016.  :  Review of Systems:  Out of a complete 14 point review of systems, all are reviewed and negative with the exception of these symptoms as listed below:   Review of Systems  Neurological:       Patient stopped C/L about 3 weeks ago as advised by on-call provider. Patient was having diarrhea. Patient has not restarted but thinks he felt better when he was taking C/L.     Objective:  Neurologic Exam  Physical Exam Physical Examination:   Filed Vitals:   04/06/16 1404  BP: 128/62  Pulse: 76  Resp: 16   General Examination: The patient is a very pleasant 82 y.o. male in no acute distress. He appears well-developed and well-nourished and well groomed. He is in good spirits today.  HEENT: Normocephalic, atraumatic, pupils are equal, round and reactive to light and accommodation. Extraocular tracking is impaired and he has limitation to upgaze, downgaze and side to side gaze. His left eye does not move as well. He is visually impaired with absence of central vision secondary to macular degeneration b/l. He can see moving targets better and has some light perception - all unchanged. Hearing is mildly impaired. Face is symmetric with mild facial masking and normal facial sensation. Speech is clear with no dysarthria noted. There may be a mild decrease in blink rate. There is moderate hypophonia, no drooling. He has no lip, neck or jaw tremor. He does have mild to moderate neck rigidity with fairly intact range of motion. There are no carotid bruits on auscultation. Oropharynx exam reveals: mild mouth dryness, adequate dental hygiene and mild airway crowding. Mallampati is class II. Tongue protrudes centrally and palate elevates symmetrically.    Chest: Clear  to auscultation without wheezing, rhonchi or crackles noted.  Heart: S1+S2+0, regular and normal without murmurs, rubs or gallops noted.   Abdomen: Soft, non-tender and non-distended with normal bowel sounds appreciated on auscultation.  Extremities: There is no pitting edema in the distal lower extremities bilaterally. Pedal pulses are intact.  Skin: Warm and dry without trophic changes noted. There are no varicose veins. Skin is hypopigmented.  Musculoskeletal: exam reveals no obvious joint deformities, tenderness or joint swelling or erythema.   Neurologically:  Mental status: The patient is awake, alert and oriented in all 4 spheres. His immediate and remote memory, attention, language skills and fund of knowledge are fairly appropriate but he does look to his wife for some of the answers and she will answer for him after delay or if he only answers briefly. He does have more significant hypophonia today. Thought process is linear. Mood is normal and affect is normal.  Cranial nerves II - XII are as described above under HEENT exam. In addition: shoulder shrug is normal with equal shoulder height noted. Motor exam: Normal bulk, strength and tone is noted for age. There is no drift, tremor or rebound.he has no telltale resting tremor. Fine motor skills with finger taps and rapid alternating patting and foot agility and foot taps are mild to moderately impaired on the right and mildly impaired on the left. There is some lateralization today to the right. He has no past pointing. Sensory exam is intact to light touch bilaterally, Romberg is negative. Reflexes are 1+ throughout.   Cerebellar testing: No dysmetria or intention tremor on finger to nose testing. Heel to shin is unremarkable bilaterally. There is no truncal or gait ataxia.  Sensory exam: intact to light touch in the upper and lower extremities.  Gait, station and balance: He stands with mild difficulty And has to push himself up from  the chair. Posture is mild to moderately stooped, stable. He stands slightly wide-based. Gait shows mild decrease in stride length and pace and decrease in arm swing bilaterally, right side more than left. He turns slowly. Balance is mildly impaired.               Assessment and Plan:   In summary, GARLON TUGGLE is a very pleasant 82 year old male with an underlying complex medical history of allergic rhinitis, legal blindness secondary to severe macular degeneration, sigmoid diverticulosis, hyperlipidemia, vitiligo, renal stones, insomnia, anxiety, recurrent prostatitis, paroxysmal atrial fibrillation, and BPH, who presents for follow-up consultation of his mild parkinsonism. He has had some progression in his symptoms and in March we tried him on Sinemet low dose with gradual titration. When he was in half a pill 3 times a day he had diarrhea and some dyscontrol of his bowel. He did not have any watery diarrhea had some abdominal pain. Wife believes that he did a little bit better motor-wise when he was on half a pill twice daily and was able to tolerated better. She did try him on Imodium as needed. He is willing to her back on half a pill once a day and we will call slower this time. He will take half a pill once daily for 2 weeks then half a pill twice daily thereafter. It may help to take it with a small snack such as saltine crackers and it may help to drink some ginger ale. He is advised to stay better hydrated and continue to follow through with the exercises that he was given by speech therapy and physical therapy. He is advised to stay active  physically and eat a healthy, balanced diet. I suggested a 2 month checkup with one of our nurse practitioners to see if he is able to tolerate the medication, perhaps push the dose even to have pill 3 times a day if tolerated otherwise we will keep him on low-dose. I will see him back after that. I answered all the questions today and the patient and his wife  were in agreement. I spent 25 minutes in total face-to-face time with the patient, more than 50% of which was spent in counseling and coordination of care, reviewing test results, reviewing medication and discussing or reviewing the diagnosis of parkinsonism, the prognosis and treatment options.

## 2016-04-06 NOTE — Patient Instructions (Addendum)
Let's try the very slow and low approach: Sinemet 25/100 mg: 1/2 pill once daily for 2 weeks, then 1/2 pill 2 times a day thereafter. Sometimes it helps to take the medication with a little bit of a snack, like saltine crackers.   Please make a follow up appointment with NP, either Aundra MilletMegan or Eber Jonesarolyn in 2 months for a brief checkup, I will see you back after that.

## 2016-04-18 ENCOUNTER — Telehealth: Payer: Self-pay | Admitting: *Deleted

## 2016-04-18 NOTE — Telephone Encounter (Signed)
Pt wife called, pt having cramping/diarrhea. Please call to discuss medication. Please call [phone].

## 2016-04-19 NOTE — Telephone Encounter (Signed)
I spoke to wife. She reports that patient started having stomach cramping and diarrhea when restarting Sinemet. Tried imodium which helped some. He has not taken Sinemet this week and is doing a little better. Still having some stomach cramping/diarrhea and some general weakness. Wife asks if there are any recommendations for his stomach cramps? And is there anything that you would like them to do since he is off of the Sinemet?

## 2016-04-19 NOTE — Telephone Encounter (Signed)
We have to stop the Sinemet. If the stomach cramps and diarrhea do not improve in the next couple of days, please ask him to see primary care physician, could be a different reason such as a stomach bug.

## 2016-04-19 NOTE — Telephone Encounter (Signed)
I spoke to wife and she is aware of recommendations below. States that he is feeling better.

## 2016-04-26 DIAGNOSIS — G4700 Insomnia, unspecified: Secondary | ICD-10-CM | POA: Diagnosis not present

## 2016-04-26 DIAGNOSIS — G20 Parkinson's disease: Secondary | ICD-10-CM | POA: Diagnosis not present

## 2016-04-26 DIAGNOSIS — E782 Mixed hyperlipidemia: Secondary | ICD-10-CM | POA: Diagnosis not present

## 2016-04-26 DIAGNOSIS — R413 Other amnesia: Secondary | ICD-10-CM | POA: Diagnosis not present

## 2016-04-26 DIAGNOSIS — H540 Blindness, both eyes: Secondary | ICD-10-CM | POA: Diagnosis not present

## 2016-04-26 DIAGNOSIS — N183 Chronic kidney disease, stage 3 (moderate): Secondary | ICD-10-CM | POA: Diagnosis not present

## 2016-04-26 DIAGNOSIS — I482 Chronic atrial fibrillation: Secondary | ICD-10-CM | POA: Diagnosis not present

## 2016-04-26 DIAGNOSIS — K591 Functional diarrhea: Secondary | ICD-10-CM | POA: Diagnosis not present

## 2016-04-26 DIAGNOSIS — L80 Vitiligo: Secondary | ICD-10-CM | POA: Diagnosis not present

## 2016-04-26 DIAGNOSIS — N400 Enlarged prostate without lower urinary tract symptoms: Secondary | ICD-10-CM | POA: Diagnosis not present

## 2016-04-26 DIAGNOSIS — K219 Gastro-esophageal reflux disease without esophagitis: Secondary | ICD-10-CM | POA: Diagnosis not present

## 2016-04-26 DIAGNOSIS — R159 Full incontinence of feces: Secondary | ICD-10-CM | POA: Diagnosis not present

## 2016-05-26 ENCOUNTER — Ambulatory Visit: Payer: PPO | Admitting: Neurology

## 2016-06-02 DIAGNOSIS — L821 Other seborrheic keratosis: Secondary | ICD-10-CM | POA: Diagnosis not present

## 2016-06-02 DIAGNOSIS — B356 Tinea cruris: Secondary | ICD-10-CM | POA: Diagnosis not present

## 2016-06-09 ENCOUNTER — Encounter: Payer: Self-pay | Admitting: Nurse Practitioner

## 2016-06-09 ENCOUNTER — Ambulatory Visit (INDEPENDENT_AMBULATORY_CARE_PROVIDER_SITE_OTHER): Payer: PPO | Admitting: Nurse Practitioner

## 2016-06-09 VITALS — BP 118/62 | HR 72 | Ht 73.0 in | Wt 181.2 lb

## 2016-06-09 DIAGNOSIS — H540 Blindness, both eyes: Secondary | ICD-10-CM

## 2016-06-09 DIAGNOSIS — G20 Parkinson's disease: Secondary | ICD-10-CM | POA: Diagnosis not present

## 2016-06-09 DIAGNOSIS — H543 Unqualified visual loss, both eyes: Secondary | ICD-10-CM

## 2016-06-09 NOTE — Progress Notes (Addendum)
[facility]  PATIENT: Dylan Hartman DOB: 23-May-1929   REASON FOR VISIT: Follow-up for primary parkinsonism, hypophonia, blindness of both eyes HISTORY FROM: Patient and wife    HISTORY OF PRESENT ILLNESS: HISTORY SAMr. Dylan Hartman is a very pleasant 82 year-old right-handed gentleman with an underlying complex medical history of allergic rhinitis, legal blindness secondary to severe macular degeneration, sigmoid diverticulosis, hyperlipidemia, vitiligo, renal stones, insomnia, anxiety, recurrent prostatitis, paroxysmal atrial fibrillation, and BPH, who presents for follow-up consultation of his parkinsonism. The patient is accompanied by his wife again today and presents for a sooner than scheduled appointment because of problems with his medication. I last saw him on 02/18/2016, at which time he reported having more difficulty getting in and out of chairs and getting out of the car. Thankfully, he had not fallen recently. He had unchanged visual symptoms. He had no significant memory decline but has mild memory loss and mild hearing loss. He was stable with his mood. His wife had noticed that he was more slower and that he would shuffle his feet and not pick up his feet as well. He had tried Sinemet in the past but had bloating and nausea with it. We mutually agreed to try again. I started him on Sinemet half a pill twice daily with gradual titration. His wife called in the interim reporting that he was not able to tolerate the medication secondary to diarrhea and abdominal pain. She did note that he had improved gait and mobility on the medication. She felt he was able to tolerate only half pill twice daily. She was advised to continue at the low dose.  UPDATE  05/03/2017SA: He reports feeling okay. He was in physical therapy and speech therapy which helped. [facility]. He had some issues with loose stool and bowel incontinence when he was on the Sinemet.  He had some abdominal pain. His wife believes that he was able to tolerated up to maybe half a pill twice a day. She thinks that he did better motor-wise when he was on it. He would be willing to try it again. He does not always drink enough water. Appetite is good. Weight has been stable. Blood pressure is good as well. UPDATE 07/06/2017CM Mr. Dylan Hartman, 82 year old male returns for follow-up. He has a history of parkinsonism and was placed on low-dose Sinemet when last seen 2 months ago by Dr. Frances FurbishAthar. Wife says he started having diarrhea on the medication and stopped it . The  Diarrhea has continued. She did feel he did better on Sinemet. No tremors noted He is continuing to do his home exercise program physical therapy and ST about 3 times a week which has been beneficial. He also has an exercise bike. No recent falls He returns for reevaluation REVIEW OF SYSTEMS: Full 14 system review of systems performed and notable only for those listed, all others are neg:  Constitutional: neg  Cardiovascular: neg Ear/Nose/Throat: Trouble swallowing at times  Skin: neg Eyes: neg Respiratory: neg Gastroitestinal: neg  Hematology/Lymphatic: neg  Endocrine: neg Musculoskeletal:neg Allergy/Immunology: neg Neurological: Speech difficulty, weakness Psychiatric: Decreased concentration, confusion, occasional hallucinations Sleep : neg   ALLERGIES: Allergies  Allergen Reactions  . Other     Bluefish,tomatoes(large amounts)  . Sinemet [Carbidopa W-Levodopa]     cramps  . Sulfa Antibiotics     HOME MEDICATIONS: Outpatient Prescriptions Prior to Visit  Medication Sig Dispense Refill  . acetaminophen (TYLENOL) 500 MG tablet Take 500 mg by mouth  as needed. Extra strength,2 tablets  Twice daily    . aspirin 81 MG tablet Take 81 mg by mouth daily.    . cholecalciferol (VITAMIN D) 1000 UNITS tablet Take 1,000 Units by mouth daily.    . clonazePAM (KLONOPIN) 1 MG tablet Take 1 mg by mouth daily.   1  . DIGOX  250 MCG tablet Take 5 weekly  0  . loratadine (CLARITIN) 10 MG tablet Take 10 mg by mouth daily as needed for allergies.    . multivitamin-lutein (OCUVITE-LUTEIN) CAPS capsule Take 1 capsule by mouth 2 (two) times daily.    . Omega-3 Fatty Acids (FISH OIL) 1200 MG CAPS Take by mouth 2 (two) times daily.     . rosuvastatin (CRESTOR) 5 MG tablet Take 5 mg by mouth 3 (three) times a week.     . venlafaxine XR (EFFEXOR-XR) 75 MG 24 hr capsule Take 75 mg by mouth daily with breakfast.   2  . carbidopa-levodopa (SINEMET IR) 25-100 MG tablet Take 1/2 pill twice daily x 1 week, then 1/2 pill 3 times a day thereafter. (Patient not taking: Reported on 04/06/2016) 30 tablet 5   No facility-administered medications prior to visit.    PAST MEDICAL HISTORY: Past Medical History  Diagnosis Date  . Paroxysmal atrial fibrillation (HCC)   . Adjustment disorder     situational  . Allergic rhinitis   . Degeneration macular     blindness  . Hypercholesterolemia   . Sigmoid diverticulosis   . Vitiligo   . Asthmatic bronchitis     episodic  . H/O renal calculi   . Diverticulum of bladder     Dr Retta Dionesahlstedt  . Hematospermia   . Insomnia   . Anxiety   . Prostatitis     episodic, last episode 12/1999  . Constipation     chronic mild  . Tinnitus   . Hemorrhoids     internal , 12/2009  . Atonic bladder     in and out  . Orchitis 05/2012  . Macular degeneration     PAST SURGICAL HISTORY: Past Surgical History  Procedure Laterality Date  . Prostate surgery  2004    T[facility]    FAMILY HISTORY: Family History  Problem Relation Age of Onset  . Stroke Mother   . Prostate cancer Father   . Heart Problems Father   . Macular degeneration Brother     SOCIAL HISTORY: Social History   Social History  . Marital Status: Married    Spouse Name: [name]  . Number of Children: 2  . Years of Education: 16   Occupational History  . Reired      retired   Social History Main Topics  . Smoking status:  Never Smoker   . Smokeless tobacco: Never Used  . Alcohol Use: No  . Drug Use: No  . Sexual Activity: Not on file   Other Topics Concern  . Not on file   Social History Narrative   Consumes one cup of caffeine daily   Pt is blind (legally).  Has macular degeneration.     [facility]     PHYSICAL EXAM  Filed Vitals:   06/09/16 1318  BP: 118/62  Pulse: 72  Height: 6\' 1"  (1.854 m)  Weight: 181 lb 3.2 oz (82.192 kg)   Body mass index is 23.91 kg/(m^2). Generalized: Well developed, in no acute distress, well groomed Head: normocephalic and atraumatic,. Oropharynx benign  Neck: Mildly rigid, no  carotid bruits  Cardiac: Regular rate rhythm, no murmur  Musculoskeletal: No deformity   Neurological examination   Mentation: Alert oriented to time, place, history taking. Attention span and concentration appropriate. Recent and remote memory intact. Follows all commands speech is hypophonic and language fluent.   Cranial nerve II-XII: Fundoscopic exam not done. He is visually impaired with absence of central vision due to macular degeneration. He has some light perception, extraocular tracking is impaired and he has limitation to upgaze and downgaze and side-to-side gaze Facial sensation and strength were normal ,he has mild facial masking. Hearing mildly impaired  Uvula tongue midline. head turning and shoulder shrug were normal and symmetric.Tongue protrusion into cheek strength was normal. Motor: normal bulk and tone, full strength in the BUE, BLE, fine finger movements impaired with foot taps finger taps and rapid alternating movements . No resting tremor. Mild cogwheeling at the left and right  wrist and elbow. Sensory: normal and symmetric to light touch,  Coordination: finger-nose-finger, heel-to-shin bilaterally, no dysmetria Reflexes: Brachioradialis 1/1, biceps 1/1, triceps 1/1, patellar 1/1, Achilles 1/1, plantar responses were flexor  bilaterally. Gait and Station: Rising up from seated position with push off, stooped posture and stance is mildly wide based. He ambulated 70 feet in the hall bilateral decreased arm swing. No difficulty with turning  DIAGNOSTIC DATA (LABS, IMAGING, TESTING) -   ASSESSMENT AND PLAN  82 y.o. year old male  has a past medical history of Paroxysmal atrial fibrillation (HCC); Adjustment disorder; Allergic rhinitis; Degeneration macular; Hypercholesterolemia; Sigmoid diverticulosis; Vitiligo; Asthmatic bronchitis;  Insomnia; Anxiety; Prostatitis;  Tinnitus; Hemorrhoids; Atonic bladder; ; and Macular degeneration. And parkinsonism here to follow-up.  Plan: Continue exercises for speech and physical therapy, home exercise program Restart carbidopa levodopa half tab twice daily for 1 week then half tablet 3 times a day May take with a cracker and ginger ale Follow-up with Dr. Frances Furbish in 3 months Nilda Riggs, [facility], APRN  [facility] [address] [place], [place] [address] [phone]  I reviewed the above note and documentation by the Nurse Practitioner and agree with the history, physical exam, assessment and plan as outlined above. I was immediately available for face-to-face consultation. Huston Foley, MD, PhD [facility])

## 2016-06-09 NOTE — Patient Instructions (Addendum)
Continue exercises for speech and physical therapy, home exercise program Restart carbidopa levodopa half tab twice daily for 1 week then half tablet 3 times a day May take with a cracker and ginger ale Follow-up with Dr. Frances FurbishAthar in 3 months

## 2016-08-23 DIAGNOSIS — L249 Irritant contact dermatitis, unspecified cause: Secondary | ICD-10-CM | POA: Diagnosis not present

## 2016-08-23 DIAGNOSIS — H401324 Pigmentary glaucoma, left eye, indeterminate stage: Secondary | ICD-10-CM | POA: Diagnosis not present

## 2016-08-23 DIAGNOSIS — H2513 Age-related nuclear cataract, bilateral: Secondary | ICD-10-CM | POA: Diagnosis not present

## 2016-08-23 DIAGNOSIS — H353132 Nonexudative age-related macular degeneration, bilateral, intermediate dry stage: Secondary | ICD-10-CM | POA: Diagnosis not present

## 2016-08-29 ENCOUNTER — Telehealth: Payer: Self-pay | Admitting: Neurology

## 2016-08-29 NOTE — Telephone Encounter (Signed)
Okay to taper off Sinemet: take 1/2 pill bid for 3 days, then 1/2 pill daily for 3 days, then stop.  Please call patient or wife to instruct.

## 2016-08-29 NOTE — Telephone Encounter (Signed)
Wife [name] called requesting call back regarding carbidopa-levodopa (SINEMET IR) 25-100 MG tablet, please call [phone], will be gone 12:30-2:00pm.

## 2016-08-29 NOTE — Telephone Encounter (Signed)
I spoke to wife. She states that for the past 3-4 months patient has had a rash and blisters develop on his scrotum. He has been to seen dermatologist who has tried several medications without relief. Wife asks if patient can stop Sinemet for a little while to see if that is the cause? [name] reports that she read that a rash is a rare side effect of Sinemet. If he can stop, how do you want him to stop the medication?

## 2016-08-29 NOTE — Telephone Encounter (Signed)
I spoke to wife and she is aware of recommendation below. She will call back if needed otherwise will see us in November for f/u appt.

## 2016-09-20 DIAGNOSIS — L300 Nummular dermatitis: Secondary | ICD-10-CM | POA: Diagnosis not present

## 2016-10-03 ENCOUNTER — Ambulatory Visit (INDEPENDENT_AMBULATORY_CARE_PROVIDER_SITE_OTHER): Payer: PPO | Admitting: Neurology

## 2016-10-03 ENCOUNTER — Encounter: Payer: Self-pay | Admitting: Neurology

## 2016-10-03 VITALS — BP 122/70 | HR 58 | Resp 16 | Ht 73.0 in | Wt 179.0 lb

## 2016-10-03 DIAGNOSIS — H543 Unqualified visual loss, both eyes: Secondary | ICD-10-CM | POA: Diagnosis not present

## 2016-10-03 DIAGNOSIS — G20 Parkinson's disease: Secondary | ICD-10-CM | POA: Diagnosis not present

## 2016-10-03 DIAGNOSIS — R21 Rash and other nonspecific skin eruption: Secondary | ICD-10-CM

## 2016-10-03 MED ORDER — CARBIDOPA-LEVODOPA 25-100 MG PO TABS: ORAL_TABLET | ORAL | 5 refills | Status: DC

## 2016-10-03 NOTE — Progress Notes (Signed)
Subjective:    Patient ID: Dylan Hartman is a 82 y.o. male.  HPI     Interim history:   Dylan Hartman is a very pleasant 82 year-old right-handed gentleman with an underlying complex medical history of allergic rhinitis, legal blindness secondary to severe macular degeneration, sigmoid diverticulosis, hyperlipidemia, vitiligo, renal stones, insomnia, anxiety, recurrent prostatitis, paroxysmal atrial fibrillation, and BPH, who presents for follow-up consultation of his parkinsonism. The patient is accompanied by his wife again today. I last saw him on 04/06/2016 for sooner than scheduled appointment because of problems with his medication. He had speech therapy and physical therapy at [facility]. He had some bowel incontinence and loose stools. He had some abdominal pain when he was on Sinemet. His wife felt that he was doing better motor-wise when he was on it but he was not really able to tolerate it.  He saw nurse practitioner, Cecille Rubin in the interim on 06/09/2016. He was advised to restart Sinemet low dose at half a pill twice daily, then increase to half a pill 3 times a day.  In the interim, his wife called on 08/29/2016 reporting that he had developed a scrotal rash and she requested that we taper off Sinemet. He had seen dermatology for this and tried several medications without help. I advised him to taper off Sinemet.    Today, 10/03/2016: He reports doing okay. Per wife, coming off of C/L has not made a difference in the scrotal rash, but dermatologist tried another ointment for the past 2 weeks and blisters are gone and rash improving, has been off of Sinemet 1 month. Did do better when on C/L per wife, as in improved mobility and it seemed like he was able to get out of the chair more easily. Has occasional diarrhea, unclear what causes it, though, wife may start a log as he can Go a week without diarrhea but then has diarrhea with fecal incontinence at times. Has had more  trouble swallowing larger pills such as multivitamin. Does not always drink enough water.   Previously:  I saw him on 02/18/2016, at which time he reported having more difficulty getting in and out of chairs and getting out of the car. Thankfully, he had not fallen recently. He had unchanged visual symptoms. He had no significant memory decline but has mild memory loss and mild hearing loss. He was stable with his mood. His wife had noticed that he was more slower and that he would shuffle his feet and not pick up his feet as well. He had tried Sinemet in the past but had bloating and nausea with it. We mutually agreed to try again. I started him on Sinemet half a pill twice daily with gradual titration. His wife called in the interim reporting that he was not able to tolerate the medication secondary to diarrhea and abdominal pain. She did note that he had improved gait and mobility on the medication. She felt he was able to tolerate only half pill twice daily. She was advised to continue at the low dose.   I saw him on 10/20/2015, at which time he reported feeling about the same, no recent falls or change in mood but did have some memory loss. He was not physically active enough. He was not able to read because of his macular degeneration but was also declining audiobooks per wife. She felt that he was not challenged enough cognitively and was not interested enough and even listening to the radio.: He  reports feeling about the same, no recent falls, no change in mood, but some memory loss. Does not do enough physical activity. He does not like to listen to the radio. He cannot read because of his macular degeneration and declined using audiobooks per wife. She feels that he is not enough challenged cognitively. He was not on Parkinson's medication at the time. We mutually agreed to follow his symptoms clinically.   I saw him on 04/28/2015, at which time he reported feeling stable. He had no recent falls.  He and his wife reside at [facility] in independent living. He likes to have dessert every day. He does not always drinking enough water, averaging about 4 glasses per day. His wife felt that his memory was not as good and he was more forgetful, misplacing things. She felt that at times he was even confused.   They have 2 grown daughters, 53 yo (in [place]) and 76 yo (local). They have 2 great grandchildren and one on the way. They go to the exercise room at [facility] about 3 times a week. They do chair yoga. He walks some but not daily. I suggested we follow him clinically. He had tried Sinemet with side effects noted. He had not felt any significant improvement with that. I asked him to stay better hydrated and use a cane for safety, especially because of his visual impairment.   In the interim, he was seen by Cecille Rubin, nurse practitioner on 10/07/2015, at which time he reported problems swallowing large pills. He was advised to stay active physically and drink enough water. He was advised to start using a cane for gait safety.   I first met him on 01/02/2015 at the request of his primary care physician, at which time the patient reported in over one year history of slowness and stiffness. I felt he had mild parkinsonism but not much in the way of lateralization. He had stopped taking his Sinemet due to side effects. I suggested clinically monitoring him and rechecking in a few months as far as his symptoms and clinical exam.   He was noted to have parkinsonism by his primary care physician, who started him on Sinemet. He noted some improvement in his symptoms, however he had GI side effects including abdominal pain and bloating. He is not particularly bothered by his symptoms. He is not impaired in his activities of daily functioning. He has had no recent falls. He does not use an aid to walk. He tries to stay active. He has not had much in the way of memory loss, hallucinations, sleep disorder,  personality changes, and reports no tremors.   His Past Medical History Is Significant For: Past Medical History:  Diagnosis Date  . Adjustment disorder    situational  . Allergic rhinitis   . Anxiety   . Asthmatic bronchitis    episodic  . Atonic bladder    in and out  . Constipation    chronic mild  . Degeneration macular    blindness  . Diverticulum of bladder    Dr Diona Fanti  . H/O renal calculi   . Hematospermia   . Hemorrhoids    internal , 12/2009  . Hypercholesterolemia   . Insomnia   . Macular degeneration   . Orchitis 05/2012  . Paroxysmal atrial fibrillation (HCC)   . Prostatitis    episodic, last episode 12/1999  . Sigmoid diverticulosis   . Tinnitus   . Vitiligo     His Past  Surgical History Is Significant For: Past Surgical History:  Procedure Laterality Date  . PROSTATE SURGERY  2004   TUR    His Family History Is Significant For: Family History  Problem Relation Age of Onset  . Stroke Mother   . Prostate cancer Father   . Heart Problems Father   . Macular degeneration Brother     His Social History Is Significant For: Social History   Social History  . Marital status: Married    Spouse name: Clarise Cruz  . Number of children: 2  . Years of education: 16   Occupational History  . Reired      retired   Social History Main Topics  . Smoking status: Never Smoker  . Smokeless tobacco: Never Used  . Alcohol use No  . Drug use: No  . Sexual activity: Not Asked   Other Topics Concern  . None   Social History Narrative   Consumes one cup of caffeine daily   Pt is blind (legally).  Has macular degeneration.     Lives at [facility] (independent living).    His Allergies Are:  Allergies  Allergen Reactions  . Other     Bluefish,tomatoes(large amounts)  . Sinemet [Carbidopa W-Levodopa]     cramps  . Sulfa Antibiotics   :   His Current Medications Are:  Outpatient Encounter Prescriptions as of 10/03/2016  Medication Sig  .  acetaminophen (TYLENOL) 500 MG tablet Take 500 mg by mouth as needed. Extra strength,2 tablets  Twice daily  . aspirin 81 MG tablet Take 81 mg by mouth daily.  . cholecalciferol (VITAMIN D) 1000 UNITS tablet Take 1,000 Units by mouth daily.  . clonazePAM (KLONOPIN) 1 MG tablet Take 1 mg by mouth daily.   Marland [place] 250 MCG tablet Take 5 weekly  . loratadine (CLARITIN) 10 MG tablet Take 10 mg by mouth daily as needed for allergies.  . mometasone (ELOCON) 0.1 % cream   . multivitamin-lutein (OCUVITE-LUTEIN) CAPS capsule Take 1 capsule by mouth 2 (two) times daily.  . Omega-3 Fatty Acids (FISH OIL) 1200 MG CAPS Take by mouth 2 (two) times daily.   . rosuvastatin (CRESTOR) 5 MG tablet Take 5 mg by mouth 3 (three) times a week.   . venlafaxine XR (EFFEXOR-XR) 75 MG 24 hr capsule Take 75 mg by mouth daily with breakfast.   . carbidopa-levodopa (SINEMET IR) 25-100 MG tablet Take 1/2 pill twice daily x 1 week, then 1/2 pill 3 times a day thereafter. (Patient not taking: Reported on 10/03/2016)  . [DISCONTINUED] econazole nitrate 1 % cream    No facility-administered encounter medications on file as of 10/03/2016.   :  Review of Systems:  Out of a complete 14 point review of systems, all are reviewed and negative with the exception of these symptoms as listed below: Review of Systems  Neurological:       Patient had developed a rash with blisters, Dermatology has tried several different treatments without relief. He stopped the Sinemet on 9/28 to see if the rash would resolve. No difference noticed. New cream started and the rash seems to be resolving. Wife asks if he should restart Sinemet.      Objective:  Neurologic Exam  Physical Exam Physical Examination:   Vitals:   10/03/16 1114  BP: 122/70  Pulse: (!) 58  Resp: 16   General Examination: The patient is a very pleasant 82 y.o. male in no acute distress. He appears well-developed and well-nourished and very  well groomed. He is in good  spirits today.  HEENT: Normocephalic, atraumatic, pupils are equal, round and reactive to light and accommodation. Extraocular tracking is impaired and he has limitation to upgaze, downgaze and side to side gaze. His left eye does not move as well. He is visually impaired with absence of central vision secondary to macular degeneration b/l. He can see moving targets better and has some light perception - all stable from before. Hearing is mildly impaired, stable. Face is symmetric with mild facial masking and normal facial sensation. Speech is clear with no dysarthria noted. There is mild decrease in blink rate. There is moderate hypophonia, no drooling. He has no lip, neck or jaw tremor. He does have mild to moderate neck rigidity with fairly intact range of motion. There are no carotid bruits on auscultation. Oropharynx exam reveals: mild mouth dryness, adequate dental hygiene and mild airway crowding. Mallampati is class II. Tongue protrudes centrally and palate elevates symmetrically.    Chest: Clear to auscultation without wheezing, rhonchi or crackles noted.  Heart: S1+S2+0, regular and normal without murmurs, rubs or gallops noted.   Abdomen: Soft, non-tender and non-distended with normal bowel sounds appreciated on auscultation.  Extremities: There is no pitting edema in the distal lower extremities bilaterally. Pedal pulses are intact.  Skin: Warm and dry without trophic changes noted. There are no varicose veins. Skin is hypopigmented.  Musculoskeletal: exam reveals no obvious joint deformities, tenderness or joint swelling or erythema.   Neurologically:  Mental status: The patient is awake, alert and oriented in all 4 spheres. His immediate and remote memory, attention, language skills and fund of knowledge are fairly appropriate, but wife provides most of the Hx. Thought process is linear. Mood is normal and affect is normal.  Cranial nerves II - XII are as described above under HEENT  exam. In addition: shoulder shrug is normal with equal shoulder height noted. Motor exam: sNormal bulk, strength and tone is noted for age. There is no drift, tremor or rebound.he has no telltale resting tremor. Fine motor skills with finger taps and rapid alternating patting and foot agility and foot taps are mild to moderately impaired on the right and mildly impaired on the left. There is some lateralization today to the right. He has no past pointing. Sensory exam is intact to light touch bilaterally, Romberg is negative. Reflexes are 1+ throughout.   Cerebellar testing: No dysmetria or intention tremor on finger to nose testing. There is no truncal or gait ataxia.  Sensory exam: intact to light touch in the upper and lower extremities.  Gait, station and balance: He stands with mild to moderate difficulty and has to push himself up from the chair. Posture is mild to moderately stooped, slightly worse. He stands slightly wide-based. Gait shows mild decrease in stride length and pace and decrease in arm swing bilaterally, right side more than left. He turns slowly. Balance is mildly impaired. Tandem walk is not possible.   Assessment and Plan:   In summary, Dylan Hartman is a very pleasant 82 year old male with an underlying complex medical history of allergic rhinitis, legal blindness secondary to severe macular degeneration, sigmoid diverticulosis, hyperlipidemia, vitiligo, renal stones, insomnia, anxiety, recurrent prostatitis, paroxysmal atrial fibrillation, and BPH, who presents for follow-up consultation of his mild parkinsonism. He has had some progression in his symptoms and we have tried him off and on on low dose Sinemet with gradual titration, but he had SEs, including abdominal pain, bloating, diarrhea in  the past, nevertheless, wife reports that he was better with his mobility when he was on it and once he got over the first couple of weeks his side effects also improved. Most recently  about a month ago we stopped the Sinemet again because of a scrotal rash and blisters. These are improving with a new ointments through with the dermatologist. He is agreeable to trying Sinemet yet again. He is advised to go slowly and cautiously. We will try with half a pill twice daily for a week and then increase it to half a pill 3 times a day. Wife is advised to look out for side effects and he is advised to monitor for diarrhea and abdominal pain as well as flareup of his rash which I believe is most likely unrelated to the Sinemet but we still have to be cautious. I suggested a three-month checkup with one of her nurse practitioners, he has seen Cecille Rubin before. I will see him back after that. I answered all her questions today and the patient and his wife were in agreement. I spent 25 minutes in total face-to-face time with the patient, more than 50% of which was spent in counseling and coordination of care, reviewing test results, reviewing medication and discussing or reviewing the diagnosis of parkinsonism, the prognosis and treatment options.

## 2016-10-03 NOTE — Patient Instructions (Signed)
Add more fiber, Citrucel, no Metamucil.  I will prescribe a walker to have at hand. Let me know, which DME company you prefer. We will gradually restart your Sinemet (generic name: carbidopa-levodopa) 25/100 mg: Take half a pill twice daily (8 AM and noon) for one week, then half a pill 3 times a day (8 AM, noon, and 4 PM) thereafter. Please try to take the medication away from you mealtimes, that is, ideally either one hour before or 2 hours after your meal to ensure optimal absorption. The medication can interfere with the protein content of your meal and trying to the protein in your food and therefore not get fully absorbed.  Common side effects reported are: Nausea, vomiting, sedation, confusion, lightheadedness. Rare side effects include hallucinations, severe nausea or vomiting, diarrhea and significant drop in blood pressure especially when going from lying to standing or from sitting to standing.

## 2016-10-11 ENCOUNTER — Ambulatory Visit: Payer: PPO | Admitting: Neurology

## 2016-10-18 ENCOUNTER — Telehealth: Payer: Self-pay | Admitting: Nurse Practitioner

## 2016-10-18 ENCOUNTER — Encounter: Payer: Self-pay | Admitting: Nurse Practitioner

## 2016-10-18 DIAGNOSIS — G20 Parkinson's disease: Secondary | ICD-10-CM

## 2016-10-18 NOTE — Telephone Encounter (Signed)
Spoke to pts wife, [name].  She wanted to get walker for pt.  Need assessment by therapist at [facility]  To know what walker would be best for pt.    I called and LMVM for shannon [phone] at [facility].

## 2016-10-18 NOTE — Telephone Encounter (Signed)
Pt's wife called in requesting a rx for a walker , either two wheels or four. He will also need a referral for PT to assess for the walker. Please call and advise

## 2016-10-19 NOTE — Telephone Encounter (Signed)
Okay to order PT eval for walker

## 2016-10-19 NOTE — Telephone Encounter (Addendum)
I spoke to shannon at [facility].  She stated that needs PT referral to evaluate and treat for walker.   Once assessed with get back to us about what kind of walker and order for this.  I see note in ofv note last seen by Dr. Frances FurbishAthar. Would you do this since she is out of office and you have seen pt.  Fax # [phone]. Attention Carollee Hertershannon, [facility].

## 2016-10-19 NOTE — Telephone Encounter (Signed)
Spoke to wife, [name] and let her know that sent in referral.  She will to hear from shannon from [facility].

## 2016-10-19 NOTE — Telephone Encounter (Signed)
Referral faxed to [phone] [facility] [place].  Received fax confirmation.

## 2016-10-24 DIAGNOSIS — R2689 Other abnormalities of gait and mobility: Secondary | ICD-10-CM | POA: Diagnosis not present

## 2016-10-24 DIAGNOSIS — R293 Abnormal posture: Secondary | ICD-10-CM | POA: Diagnosis not present

## 2016-10-24 DIAGNOSIS — G20 Parkinson's disease: Secondary | ICD-10-CM | POA: Diagnosis not present

## 2016-10-24 DIAGNOSIS — Z9181 History of falling: Secondary | ICD-10-CM | POA: Diagnosis not present

## 2016-10-31 DIAGNOSIS — R2689 Other abnormalities of gait and mobility: Secondary | ICD-10-CM | POA: Diagnosis not present

## 2016-10-31 DIAGNOSIS — G20 Parkinson's disease: Secondary | ICD-10-CM | POA: Diagnosis not present

## 2016-10-31 DIAGNOSIS — Z9181 History of falling: Secondary | ICD-10-CM | POA: Diagnosis not present

## 2016-10-31 DIAGNOSIS — R293 Abnormal posture: Secondary | ICD-10-CM | POA: Diagnosis not present

## 2016-11-02 DIAGNOSIS — R413 Other amnesia: Secondary | ICD-10-CM | POA: Diagnosis not present

## 2016-11-02 DIAGNOSIS — N400 Enlarged prostate without lower urinary tract symptoms: Secondary | ICD-10-CM | POA: Diagnosis not present

## 2016-11-02 DIAGNOSIS — R159 Full incontinence of feces: Secondary | ICD-10-CM | POA: Diagnosis not present

## 2016-11-02 DIAGNOSIS — H3530 Unspecified macular degeneration: Secondary | ICD-10-CM | POA: Diagnosis not present

## 2016-11-02 DIAGNOSIS — F329 Major depressive disorder, single episode, unspecified: Secondary | ICD-10-CM | POA: Diagnosis not present

## 2016-11-02 DIAGNOSIS — K219 Gastro-esophageal reflux disease without esophagitis: Secondary | ICD-10-CM | POA: Diagnosis not present

## 2016-11-02 DIAGNOSIS — Z79899 Other long term (current) drug therapy: Secondary | ICD-10-CM | POA: Diagnosis not present

## 2016-11-02 DIAGNOSIS — N183 Chronic kidney disease, stage 3 (moderate): Secondary | ICD-10-CM | POA: Diagnosis not present

## 2016-11-02 DIAGNOSIS — Z0000 Encounter for general adult medical examination without abnormal findings: Secondary | ICD-10-CM | POA: Diagnosis not present

## 2016-11-02 DIAGNOSIS — G4700 Insomnia, unspecified: Secondary | ICD-10-CM | POA: Diagnosis not present

## 2016-11-02 DIAGNOSIS — I482 Chronic atrial fibrillation: Secondary | ICD-10-CM | POA: Diagnosis not present

## 2016-11-02 DIAGNOSIS — K591 Functional diarrhea: Secondary | ICD-10-CM | POA: Diagnosis not present

## 2016-11-02 DIAGNOSIS — Z23 Encounter for immunization: Secondary | ICD-10-CM | POA: Diagnosis not present

## 2016-11-02 DIAGNOSIS — E559 Vitamin D deficiency, unspecified: Secondary | ICD-10-CM | POA: Diagnosis not present

## 2016-11-02 DIAGNOSIS — Z1389 Encounter for screening for other disorder: Secondary | ICD-10-CM | POA: Diagnosis not present

## 2016-11-02 DIAGNOSIS — L80 Vitiligo: Secondary | ICD-10-CM | POA: Diagnosis not present

## 2016-11-02 DIAGNOSIS — G20 Parkinson's disease: Secondary | ICD-10-CM | POA: Diagnosis not present

## 2016-11-02 DIAGNOSIS — E782 Mixed hyperlipidemia: Secondary | ICD-10-CM | POA: Diagnosis not present

## 2016-11-24 ENCOUNTER — Telehealth: Payer: Self-pay | Admitting: Neurology

## 2016-11-24 NOTE — Telephone Encounter (Signed)
I called pt. Pt has an appt on 01/05/2017 with Eber Jonesarolyn, NP but pt's wife wants him to be seen sooner. Per wife, pt has had a physical decline over the past few weeks. Pt was so weak this morning that he could not get out of bed. Pt has also been experiencing incontinence. Pt has not been taking the sinemet as recommended by Dr. Frances FurbishAthar because it gave him diarrhea and "belly pain" each time they tried it. The diarrhea was controlled with imodium daily with the sinemet, but pt's wife is concerned that he should not be taking imodium daily. Pt wants to know if it is ok for pt to take the sinemet daily with imodium to help with diarrhea? I also offered pt's wife an appt with Dr. Frances FurbishAthar today at 1:00pm, but pt's wife cannot take him today because she does not have transportation.  Pt's wife reports that [place] and she will call the physician there to see if pt can be seen today. I encouraged pt's wife to do this.  I moved up pt's appt with Eber Jonesarolyn, NP to 12/08/2016 at 2:45. Pt's wife has assistance on Thursdays with the pt so this appt works well.  Pt's wife wants Dr. Teofilo PodAthar's opinion on pt's sinemet and if he should resume it and take imodium daily with it.

## 2016-11-24 NOTE — Telephone Encounter (Signed)
Pts wife [name] called stating patient physical shape has been declining.  Patient was unable to get out of bed this morning, Wife is having to help pt walk even with walker.  She would like a call back please.

## 2016-11-24 NOTE — Telephone Encounter (Signed)
Please ask them to hold off on the Sinemet until he sees CM.  The reality is that he was never able to tolerate it and at best was only able to take a very low dose. [facility]. I am not sure it is is safe to take Imodium daily.  Please relay back to wife.

## 2016-11-24 NOTE — Telephone Encounter (Signed)
I spoke to pt's wife and advised them to not restart the sinemet at this time. I advised pt's wife that Dr. Frances FurbishAthar is not sure that it is safe to take imodium daily. I also advised pt's wife that Dr. Frances FurbishAthar is considering a second opinion for pt at [facility] or [facility]. I reminded pt's wife of their appt with Eber Jonesarolyn, NP and asked them to arrive 15 minutes early to the 2:45pm appt. Pt's wife  verbalized understanding.

## 2016-11-25 DIAGNOSIS — Z7689 Persons encountering health services in other specified circumstances: Secondary | ICD-10-CM | POA: Diagnosis not present

## 2016-12-08 ENCOUNTER — Encounter: Payer: Self-pay | Admitting: Nurse Practitioner

## 2016-12-08 ENCOUNTER — Ambulatory Visit (INDEPENDENT_AMBULATORY_CARE_PROVIDER_SITE_OTHER): Payer: PPO | Admitting: Nurse Practitioner

## 2016-12-08 VITALS — BP 110/66 | HR 74 | Ht 73.0 in | Wt 177.2 lb

## 2016-12-08 DIAGNOSIS — G20 Parkinson's disease: Secondary | ICD-10-CM | POA: Diagnosis not present

## 2016-12-08 DIAGNOSIS — G20A1 Parkinson's disease without dyskinesia, without mention of fluctuations: Secondary | ICD-10-CM

## 2016-12-08 DIAGNOSIS — H543 Unqualified visual loss, both eyes: Secondary | ICD-10-CM | POA: Diagnosis not present

## 2016-12-08 DIAGNOSIS — R269 Unspecified abnormalities of gait and mobility: Secondary | ICD-10-CM

## 2016-12-08 NOTE — Patient Instructions (Signed)
Restart Sinemet tablet 3 times daily Continue this dose until seen by Dr. Frances FurbishAthar in 3 months Given patient information On Parkinson's disease to include resources questions and answers physical therapy driving falls prevention exercise chart etc.

## 2016-12-08 NOTE — Progress Notes (Addendum)
[facility]  PATIENT: Dylan Hartman DOB: 1929/04/25   REASON FOR VISIT: Follow-up for Parkinson's disease HISTORY FROM: Patient and daughter and wife    HISTORY OF PRESENT ILLNESS:Dylan Hartman is a very pleasant 83 year-old right-handed gentleman with an underlying complex medical history of allergic rhinitis, legal blindness secondary to severe macular degeneration, sigmoid diverticulosis, hyperlipidemia, vitiligo, renal stones, insomnia, anxiety, recurrent prostatitis, paroxysmal atrial fibrillation, and BPH, who presents for follow-up consultation of his parkinsonism. The patient is accompanied by his wife again today. I last saw him on 04/06/2016 for sooner than scheduled appointment because of problems with his medication. He had speech therapy and physical therapy at [facility]. He had some bowel incontinence and loose stools. He had some abdominal pain when he was on Sinemet. His wife felt that he was doing better motor-wise when he was on it but he was not really able to tolerate it.  He saw nurse practitioner, Darrol Angel in the interim on 06/09/2016. He was advised to restart Sinemet low dose at half a pill twice daily, then increase to half a pill 3 times a day.  In the interim, his wife called on 08/29/2016 reporting that he had developed a scrotal rash and she requested that we taper off Sinemet. He had seen dermatology for this and tried several medications without help. I advised him to taper off Sinemet.    Today, 10/03/2016: He reports doing okay. Per wife, coming off of C/L has not made a difference in the scrotal rash, but dermatologist tried another ointment for the past 2 weeks and blisters are gone and rash improving, has been off of Sinemet 1 month. Did do better when on C/L per wife, as in improved mobility and it seemed like he was able to get out of the chair more easily. Has occasional diarrhea, unclear what causes it, though, wife may start  a log as he can Go a week without diarrhea but then has diarrhea with fecal incontinence at times. Has had more trouble swallowing larger pills such as multivitamin. Does not always drink enough water. UPDATE 01/04/2018CM  Dylan Hartman, 83 year old male returns for follow-up. He has had several falls since last seen by Dr. Frances Furbish. He is ambulating with a walker today. He has been off his Sinemet for 2-3 weeks and has noted worsening of his motor symptoms. Has home instead  coming out 4 hours a day . He stopped his Sinemet due to diarrhea but the diarrhea has continued. Appetite is fairly stable. He has had more drooling and difficulty swallowing. He's had some numbness in the left foot at night that awakens him from sleep. He has an occasional hallucination he is legally blind. He returns for reevaluation  REVIEW OF SYSTEMS: Full 14 system review of systems performed and notable only for those listed, all others are neg:  Constitutional: neg  Cardiovascular: neg Ear/Nose/Throat: neg  Skin: neg Eyes: neg Respiratory: neg Gastroitestinal: Diarrhea, incontinence of bowels Hematology/Lymphatic: neg  Endocrine: neg Musculoskeletal: Walking difficulty Allergy/Immunology: neg Neurological: Speech difficulty Psychiatric: Confusion, occasional hallucination Sleep : neg   ALLERGIES: Allergies  Allergen Reactions  . Other     Bluefish,tomatoes(large amounts)  . Sinemet [Carbidopa W-Levodopa]     cramps  . Sulfa Antibiotics     HOME MEDICATIONS: Outpatient Medications Prior to Visit  Medication Sig Dispense Refill  . acetaminophen (TYLENOL) 500 MG tablet Take 500 mg by mouth as needed. Extra strength,2 tablets  Twice daily    .  aspirin 81 MG tablet Take 81 mg by mouth daily.    . cholecalciferol (VITAMIN D) 1000 UNITS tablet Take 1,000 Units by mouth daily.    . clonazePAM (KLONOPIN) 1 MG tablet Take 1 mg by mouth daily.   1  . DIGOX 250 MCG tablet Take 5 weekly  0  . loratadine (CLARITIN) 10  MG tablet Take 10 mg by mouth daily as needed for allergies.    . Omega-3 Fatty Acids (FISH OIL) 1200 MG CAPS Take by mouth 2 (two) times daily.     . rosuvastatin (CRESTOR) 5 MG tablet Take 5 mg by mouth 3 (three) times a week.     . venlafaxine XR (EFFEXOR-XR) 75 MG 24 hr capsule Take 75 mg by mouth daily with breakfast.   2  . carbidopa-levodopa (SINEMET IR) 25-100 MG tablet Take 1/2 pill twice daily x 1 week, then 1/2 pill 3 times a day thereafter. (Patient not taking: Reported on 12/08/2016) 30 tablet 5  . mometasone (ELOCON) 0.1 % cream     . multivitamin-lutein (OCUVITE-LUTEIN) CAPS capsule Take 1 capsule by mouth 2 (two) times daily.     No facility-administered medications prior to visit.     PAST MEDICAL HISTORY: Past Medical History:  Diagnosis Date  . Adjustment disorder    situational  . Allergic rhinitis   . Anxiety   . Asthmatic bronchitis    episodic  . Atonic bladder    in and out  . Constipation    chronic mild  . Degeneration macular    blindness  . Diverticulum of bladder    Dr Retta Dionesahlstedt  . H/O renal calculi   . Hematospermia   . Hemorrhoids    internal , 12/2009  . Hypercholesterolemia   . Insomnia   . Macular degeneration   . Orchitis 05/2012  . Paroxysmal atrial fibrillation (HCC)   . Prostatitis    episodic, last episode 12/1999  . Sigmoid diverticulosis   . Tinnitus   . Vitiligo     PAST SURGICAL HISTORY: Past Surgical History:  Procedure Laterality Date  . PROSTATE SURGERY  2004   TUR    FAMILY HISTORY: Family History  Problem Relation Age of Onset  . Stroke Mother   . Prostate cancer Father   . Heart Problems Father   . Macular degeneration Brother     SOCIAL HISTORY: Social History   Social History  . Marital status: Married    Spouse name: Dylan Hartman  . Number of children: 2  . Years of education: 16   Occupational History  . Reired      retired   Social History Main Topics  . Smoking status: Never Smoker  . Smokeless  tobacco: Never Used  . Alcohol use No  . Drug use: No  . Sexual activity: Not on file   Other Topics Concern  . Not on file   Social History Narrative   Consumes one cup of caffeine daily   Pt is blind (legally).  Has macular degeneration.     [facility]     PHYSICAL EXAM  Vitals:   12/08/16 1435  BP: 110/66  Pulse: 74  Weight: 177 lb 3.2 oz (80.4 kg)  Height: 6\' 1"  (1.854 m)   Body mass index is 23.38 kg/m. Generalized: Well developed, in no acute distress, well groomed, masking of the face Head: normocephalic and atraumatic,. Oropharynx benign  Neck: Mildly rigid, no carotid bruits  Cardiac: Regular rate rhythm,  Musculoskeletal: No deformity   Neurological examination   Mentation: Alert oriented to time, place, history taking. Attention span and concentration appropriate. Recent and remote memory intact. Follows all commands speech is hypophonic and language fluent.   Cranial nerve II-XII: Fundoscopic exam not done. He is visually impaired with absence of central vision due to macular degeneration. He has some light perception, extraocular tracking is impaired and he has limitation to upgaze and downgaze and side-to-side gaze Facial sensation and strength were normal ,he has mild facial masking. Hearing mildly impaired  Uvula tongue midline. head turning and shoulder shrug were normal and symmetric.Tongue protrusion into cheek strength was normal. Motor: normal bulk and tone, full strength in the BUE, BLE, fine finger movements impaired with foot taps finger taps and rapid alternating movements . No resting tremor. Mild cogwheeling at the left and right  wrist and elbow. Sensory: normal and symmetric to light touch,  Coordination: finger-nose-finger, heel-to-shin bilaterally, no dysmetria Reflexes: Brachioradialis 1/1, biceps 1/1, triceps 1/1, patellar 1/1, Achilles 1/1, plantar responses were flexor bilaterally. Gait and  Station: Rising up from seated position with push off, stooped posture and stance is mildly wide based. He ambulated 70 feet in the hall but his walker, no freezing episodes ,No difficulty with turning  DIAGNOSTIC DATA (LABS, IMAGING, TESTING) - I reviewed patient records, labs, notes, testing and imaging myself where available.    ASSESSMENT AND PLAN  83 y.o. year old male  has a past medical history of past medical history of Paroxysmal atrial fibrillation (HCC); Adjustment disorder; Allergic rhinitis; Degeneration macular; Hypercholesterolemia; Sigmoid diverticulosis; Vitiligo; Asthmatic bronchitis;  Insomnia; Anxiety; Prostatitis;  Tinnitus; Hemorrhoids; Atonic bladder; ; and Macular degeneration. And parkinsonism here to follow-up. PLAN: Restart Sinemet tablet 3 times daily 1/2 tab  Continue this dose until seen by Dr. Frances Furbish in 3 months Use walker at all times for safe ambulation Given patient and wife information On Parkinson's disease to include resources questions and answers physical therapy  falls prevention exercise chart etc. I spent 25 minutes in total face-to-face time with the patient, more than 50% of which was spent in counseling and coordination of care, reviewing test results, reviewing medication and discussing or reviewing the diagnosis of parkinsonism, the prognosis and treatment options. Nilda Riggs, [facility], [facility], APRN  [facility] [address] [place], [place] [address] [phone]  I reviewed the above note and documentation by the Nurse Practitioner and agree with the history, physical exam, assessment and plan as outlined above. I was immediately available for face-to-face consultation. Huston Foley, MD, PhD [facility])

## 2017-01-05 ENCOUNTER — Ambulatory Visit: Payer: PPO | Admitting: Nurse Practitioner

## 2017-01-06 DIAGNOSIS — K591 Functional diarrhea: Secondary | ICD-10-CM | POA: Diagnosis not present

## 2017-01-10 ENCOUNTER — Telehealth: Payer: Self-pay | Admitting: Neurology

## 2017-01-10 DIAGNOSIS — G20 Parkinson's disease: Secondary | ICD-10-CM

## 2017-01-10 NOTE — Telephone Encounter (Signed)
Per a telephone note from 11/24/2017, it appears that Dr. Frances FurbishAthar is considering a second opinion [facility] or [facility].  Ok to place this referral?

## 2017-01-10 NOTE — Telephone Encounter (Signed)
Patients wife called in reference to see if they can go ahead and proceed with [facility]. Who specialist in Parkinsons.  Patients wife would like this done and not have to wait until April when patient is scheduled to come back to see Dr. Frances FurbishAthar.

## 2017-01-12 NOTE — Addendum Note (Signed)
Addended by: Huston FoleyATHAR, Elsi Stelzer on: 01/12/2017 02:35 PM   Modules accepted: Orders

## 2017-01-12 NOTE — Telephone Encounter (Signed)
I called pt to discuss. Left a message asking pt to call me back.

## 2017-01-12 NOTE — Telephone Encounter (Signed)
Will refer to [facility], Movement D/o

## 2017-01-16 NOTE — Telephone Encounter (Signed)
Baxter HireKristen, Can you see a referral ?

## 2017-01-16 NOTE — Telephone Encounter (Signed)
Pt's wife, [name], returned my call, and I advised her that Dr. Frances FurbishAthar has [facility] a [facility]. Pt's wife verbalized understanding and wants to know when [facility] will be calling them? Will send to [facility] to review.

## 2017-01-16 NOTE — Telephone Encounter (Signed)
Yes, it is a neurology referral placed on 01/12/17 by Dr. Frances FurbishAthar.

## 2017-01-17 NOTE — Telephone Encounter (Signed)
Spoke to patient's wife and relayed Patient's husband  has been [facility]  relayed telephone number to her. [phone].  [facility] for scheduling.

## 2017-01-18 ENCOUNTER — Ambulatory Visit (INDEPENDENT_AMBULATORY_CARE_PROVIDER_SITE_OTHER): Payer: PPO | Admitting: Ophthalmology

## 2017-01-18 DIAGNOSIS — H2513 Age-related nuclear cataract, bilateral: Secondary | ICD-10-CM

## 2017-01-18 DIAGNOSIS — H43813 Vitreous degeneration, bilateral: Secondary | ICD-10-CM

## 2017-01-18 DIAGNOSIS — H353134 Nonexudative age-related macular degeneration, bilateral, advanced atrophic with subfoveal involvement: Secondary | ICD-10-CM

## 2017-01-30 ENCOUNTER — Telehealth: Payer: Self-pay | Admitting: Neurology

## 2017-01-30 NOTE — Telephone Encounter (Signed)
Annabelle HarmanDana, can you call [facility] and see if they can get pt in sooner than June 5th, per MD request?

## 2017-01-30 NOTE — Telephone Encounter (Signed)
Patient called office in reference to appointment for [facility] patient is scheduled for June 5th.  Per wife they advised her if patient was needing to be seen soon the referring Dr. Stann MainlandWill need to call directly to see about a sooner appointment.   Patient states she would like for patient to be seen sooner than June.  Please call wife will be out of the home from 4:00-4:30pm.

## 2017-01-30 NOTE — Telephone Encounter (Signed)
Patient is on CX list a doctor to Doctor call will have to be done. I already tried to get patient in sooner.

## 2017-01-31 NOTE — Telephone Encounter (Signed)
I called Lorene DyChristie again, no answer, did not leave another VM.

## 2017-01-31 NOTE — Telephone Encounter (Signed)
I called, left a message with Lorene DyChristie at [facility] asking her to call me back regarding this pt.

## 2017-02-01 NOTE — Telephone Encounter (Signed)
I called Dylan Hartman again at [facility]. Left another message asking her to call me back.

## 2017-02-02 NOTE — Telephone Encounter (Signed)
I called pt, spoke to pt's wife, advised her that we are working on getting pt a sooner appt.  I called [facility]. Pt is already on the cancellation list, but they will have a nurse case manager review pt's chart and see if he can be seen sooner. They want me to let pt know to call and check on if there has been cancellations every so often.  I called pt's wife and advised her of this information. I encouraged pt's wife to call to see if they have cancellations about once a week. Pt's wife verbalized understanding.

## 2017-03-02 DIAGNOSIS — R159 Full incontinence of feces: Secondary | ICD-10-CM | POA: Diagnosis not present

## 2017-03-02 DIAGNOSIS — R197 Diarrhea, unspecified: Secondary | ICD-10-CM | POA: Diagnosis not present

## 2017-03-06 ENCOUNTER — Ambulatory Visit: Payer: PPO | Admitting: Neurology

## 2017-03-08 ENCOUNTER — Ambulatory Visit: Payer: PPO | Admitting: Neurology

## 2017-03-23 ENCOUNTER — Telehealth: Payer: Self-pay | Admitting: Neurology

## 2017-03-23 NOTE — Telephone Encounter (Signed)
I called [facility] [phone] for sooner apt. Spoke to Financial planner, she is going to call me back in 48 hours with a sooner apt. I called the patients wife (listed on DPR) and let her know what was going on. Apt has been cancelled for Monday.

## 2017-03-27 ENCOUNTER — Ambulatory Visit: Payer: PPO | Admitting: Neurology

## 2017-03-28 DIAGNOSIS — R41842 Visuospatial deficit: Secondary | ICD-10-CM | POA: Diagnosis not present

## 2017-03-28 DIAGNOSIS — G20 Parkinson's disease: Secondary | ICD-10-CM | POA: Diagnosis not present

## 2017-03-28 DIAGNOSIS — H3530 Unspecified macular degeneration: Secondary | ICD-10-CM | POA: Diagnosis not present

## 2017-03-28 DIAGNOSIS — Z9181 History of falling: Secondary | ICD-10-CM | POA: Diagnosis not present

## 2017-03-28 DIAGNOSIS — R2681 Unsteadiness on feet: Secondary | ICD-10-CM | POA: Diagnosis not present

## 2017-03-28 DIAGNOSIS — R278 Other lack of coordination: Secondary | ICD-10-CM | POA: Diagnosis not present

## 2017-04-04 DIAGNOSIS — G20 Parkinson's disease: Secondary | ICD-10-CM | POA: Diagnosis not present

## 2017-04-04 DIAGNOSIS — R278 Other lack of coordination: Secondary | ICD-10-CM | POA: Diagnosis not present

## 2017-04-04 DIAGNOSIS — H3530 Unspecified macular degeneration: Secondary | ICD-10-CM | POA: Diagnosis not present

## 2017-04-04 DIAGNOSIS — R41842 Visuospatial deficit: Secondary | ICD-10-CM | POA: Diagnosis not present

## 2017-04-04 DIAGNOSIS — R2681 Unsteadiness on feet: Secondary | ICD-10-CM | POA: Diagnosis not present

## 2017-04-04 DIAGNOSIS — Z9181 History of falling: Secondary | ICD-10-CM | POA: Diagnosis not present

## 2017-04-04 NOTE — Telephone Encounter (Signed)
I called the patients wife to let her know that there is still no earlier apt available and to make sure she had the number to [facility] if she wants to call and check the status. She stated that she did have the number (and read it off correctly to me). I told her to call us back if she has any other questions or concerns.

## 2017-04-06 ENCOUNTER — Other Ambulatory Visit: Payer: Self-pay | Admitting: Neurology

## 2017-04-06 DIAGNOSIS — G20 Parkinson's disease: Secondary | ICD-10-CM

## 2017-04-07 NOTE — Telephone Encounter (Signed)
Called and spoke with patient wife. Advised refills sent to pharmacy as requested. She verbalized understanding and appreciation for call.

## 2017-04-07 NOTE — Telephone Encounter (Signed)
Pt wife calling back re: status of medication refill, she said pt has just enough for today and she doesn't want him to be without for the weekend.  Please advise

## 2017-04-20 DIAGNOSIS — L120 Bullous pemphigoid: Secondary | ICD-10-CM | POA: Diagnosis not present

## 2017-05-02 DIAGNOSIS — L299 Pruritus, unspecified: Secondary | ICD-10-CM | POA: Diagnosis not present

## 2017-05-02 DIAGNOSIS — L120 Bullous pemphigoid: Secondary | ICD-10-CM | POA: Diagnosis not present

## 2017-05-09 DIAGNOSIS — F419 Anxiety disorder, unspecified: Secondary | ICD-10-CM | POA: Diagnosis not present

## 2017-05-09 DIAGNOSIS — R32 Unspecified urinary incontinence: Secondary | ICD-10-CM | POA: Diagnosis not present

## 2017-05-09 DIAGNOSIS — G20 Parkinson's disease: Secondary | ICD-10-CM | POA: Diagnosis not present

## 2017-05-09 DIAGNOSIS — Z79899 Other long term (current) drug therapy: Secondary | ICD-10-CM | POA: Diagnosis not present

## 2017-05-15 DIAGNOSIS — K1230 Oral mucositis (ulcerative), unspecified: Secondary | ICD-10-CM | POA: Diagnosis not present

## 2017-05-24 DIAGNOSIS — R209 Unspecified disturbances of skin sensation: Secondary | ICD-10-CM | POA: Diagnosis not present

## 2017-05-24 DIAGNOSIS — Z0189 Encounter for other specified special examinations: Secondary | ICD-10-CM | POA: Diagnosis not present

## 2017-05-24 DIAGNOSIS — R2689 Other abnormalities of gait and mobility: Secondary | ICD-10-CM | POA: Diagnosis not present

## 2017-05-24 DIAGNOSIS — Z1329 Encounter for screening for other suspected endocrine disorder: Secondary | ICD-10-CM | POA: Diagnosis not present

## 2017-05-24 DIAGNOSIS — N189 Chronic kidney disease, unspecified: Secondary | ICD-10-CM | POA: Diagnosis not present

## 2017-05-31 DIAGNOSIS — G20 Parkinson's disease: Secondary | ICD-10-CM | POA: Diagnosis not present

## 2017-05-31 DIAGNOSIS — E782 Mixed hyperlipidemia: Secondary | ICD-10-CM | POA: Diagnosis not present

## 2017-05-31 DIAGNOSIS — I482 Chronic atrial fibrillation: Secondary | ICD-10-CM | POA: Diagnosis not present

## 2017-06-01 DIAGNOSIS — L120 Bullous pemphigoid: Secondary | ICD-10-CM | POA: Diagnosis not present

## 2017-06-23 DIAGNOSIS — I4891 Unspecified atrial fibrillation: Secondary | ICD-10-CM | POA: Diagnosis not present

## 2017-06-23 DIAGNOSIS — R41841 Cognitive communication deficit: Secondary | ICD-10-CM | POA: Diagnosis not present

## 2017-06-23 DIAGNOSIS — F0390 Unspecified dementia without behavioral disturbance: Secondary | ICD-10-CM | POA: Diagnosis not present

## 2017-06-23 DIAGNOSIS — R609 Edema, unspecified: Secondary | ICD-10-CM | POA: Diagnosis not present

## 2017-06-23 DIAGNOSIS — G20 Parkinson's disease: Secondary | ICD-10-CM | POA: Diagnosis not present

## 2017-06-23 DIAGNOSIS — R1312 Dysphagia, oropharyngeal phase: Secondary | ICD-10-CM | POA: Diagnosis not present

## 2017-06-23 DIAGNOSIS — L120 Bullous pemphigoid: Secondary | ICD-10-CM | POA: Diagnosis not present

## 2017-06-29 DIAGNOSIS — I4891 Unspecified atrial fibrillation: Secondary | ICD-10-CM | POA: Diagnosis not present

## 2017-06-29 DIAGNOSIS — F0390 Unspecified dementia without behavioral disturbance: Secondary | ICD-10-CM | POA: Diagnosis not present

## 2017-06-29 DIAGNOSIS — R609 Edema, unspecified: Secondary | ICD-10-CM | POA: Diagnosis not present

## 2017-06-29 DIAGNOSIS — L120 Bullous pemphigoid: Secondary | ICD-10-CM | POA: Diagnosis not present

## 2017-06-29 DIAGNOSIS — G20 Parkinson's disease: Secondary | ICD-10-CM | POA: Diagnosis not present

## 2017-07-04 DIAGNOSIS — R1312 Dysphagia, oropharyngeal phase: Secondary | ICD-10-CM | POA: Diagnosis not present

## 2017-07-11 DIAGNOSIS — L120 Bullous pemphigoid: Secondary | ICD-10-CM | POA: Diagnosis not present

## 2017-07-13 DIAGNOSIS — K219 Gastro-esophageal reflux disease without esophagitis: Secondary | ICD-10-CM | POA: Diagnosis not present

## 2017-07-13 DIAGNOSIS — F0280 Dementia in other diseases classified elsewhere without behavioral disturbance: Secondary | ICD-10-CM | POA: Diagnosis not present

## 2017-07-13 DIAGNOSIS — R1312 Dysphagia, oropharyngeal phase: Secondary | ICD-10-CM | POA: Diagnosis not present

## 2017-07-13 DIAGNOSIS — G20 Parkinson's disease: Secondary | ICD-10-CM | POA: Diagnosis not present

## 2017-07-13 DIAGNOSIS — R41841 Cognitive communication deficit: Secondary | ICD-10-CM | POA: Diagnosis not present

## 2017-07-13 DIAGNOSIS — R498 Other voice and resonance disorders: Secondary | ICD-10-CM | POA: Diagnosis not present

## 2017-08-07 DIAGNOSIS — R498 Other voice and resonance disorders: Secondary | ICD-10-CM | POA: Diagnosis not present

## 2017-08-07 DIAGNOSIS — F0280 Dementia in other diseases classified elsewhere without behavioral disturbance: Secondary | ICD-10-CM | POA: Diagnosis not present

## 2017-08-07 DIAGNOSIS — K219 Gastro-esophageal reflux disease without esophagitis: Secondary | ICD-10-CM | POA: Diagnosis not present

## 2017-08-07 DIAGNOSIS — R1312 Dysphagia, oropharyngeal phase: Secondary | ICD-10-CM | POA: Diagnosis not present

## 2017-08-07 DIAGNOSIS — R41841 Cognitive communication deficit: Secondary | ICD-10-CM | POA: Diagnosis not present

## 2017-08-07 DIAGNOSIS — G20 Parkinson's disease: Secondary | ICD-10-CM | POA: Diagnosis not present

## 2017-08-09 DIAGNOSIS — G20 Parkinson's disease: Secondary | ICD-10-CM | POA: Diagnosis not present

## 2017-08-09 DIAGNOSIS — L120 Bullous pemphigoid: Secondary | ICD-10-CM | POA: Diagnosis not present

## 2017-08-14 DIAGNOSIS — R05 Cough: Secondary | ICD-10-CM | POA: Diagnosis not present

## 2017-08-14 DIAGNOSIS — R918 Other nonspecific abnormal finding of lung field: Secondary | ICD-10-CM | POA: Diagnosis not present

## 2017-08-15 ENCOUNTER — Encounter (HOSPITAL_BASED_OUTPATIENT_CLINIC_OR_DEPARTMENT_OTHER): Payer: Self-pay | Admitting: Emergency Medicine

## 2017-08-15 ENCOUNTER — Emergency Department (HOSPITAL_BASED_OUTPATIENT_CLINIC_OR_DEPARTMENT_OTHER)
Admission: EM | Admit: 2017-08-15 | Discharge: 2017-08-15 | Disposition: A | Discharge status: Skilled Nursing Facility | Payer: PPO | Attending: Emergency Medicine | Admitting: Emergency Medicine

## 2017-08-15 ENCOUNTER — Emergency Department (HOSPITAL_BASED_OUTPATIENT_CLINIC_OR_DEPARTMENT_OTHER): Payer: PPO

## 2017-08-15 DIAGNOSIS — Z79899 Other long term (current) drug therapy: Secondary | ICD-10-CM | POA: Diagnosis not present

## 2017-08-15 DIAGNOSIS — R51 Headache: Secondary | ICD-10-CM | POA: Insufficient documentation

## 2017-08-15 DIAGNOSIS — S0993XA Unspecified injury of face, initial encounter: Secondary | ICD-10-CM | POA: Diagnosis not present

## 2017-08-15 DIAGNOSIS — Z7982 Long term (current) use of aspirin: Secondary | ICD-10-CM | POA: Diagnosis not present

## 2017-08-15 DIAGNOSIS — S0990XA Unspecified injury of head, initial encounter: Secondary | ICD-10-CM

## 2017-08-15 DIAGNOSIS — W19XXXA Unspecified fall, initial encounter: Secondary | ICD-10-CM

## 2017-08-15 DIAGNOSIS — R4182 Altered mental status, unspecified: Secondary | ICD-10-CM | POA: Diagnosis not present

## 2017-08-15 NOTE — ED Notes (Signed)
ED Provider at bedside. 

## 2017-08-15 NOTE — ED Provider Notes (Signed)
[facility] Provider Note   CSN: [id number] Arrival date & time: 08/15/17  1828     History   Chief Complaint Chief Complaint  Patient presents with  . Fall    HPI Dylan DikeRobert E Alperin is a 83 y.o. male.  HPI 83 year old male with a history of Parkinson's disease who lives in a skilled nursing facility presents to the emergency department after a mechanical fall from standing resulting in head trauma and occipital scalp hematoma. He reports that the patient was trying to get up unassisted and stiffened up resulting in him falling backwards. No loss of consciousness. Patient is not on any anticoagulation. Denies any other physical complaints. No alleviating or aggravating factors.  Past Medical History:  Diagnosis Date  . Adjustment disorder    situational  . Allergic rhinitis   . Anxiety   . Asthmatic bronchitis    episodic  . Atonic bladder    in and out  . Constipation    chronic mild  . Degeneration macular    blindness  . Diverticulum of bladder    Dr Retta Dionesahlstedt  . H/O renal calculi   . Hematospermia   . Hemorrhoids    internal , 12/2009  . Hypercholesterolemia   . Insomnia   . Macular degeneration   . Orchitis 05/2012  . Paroxysmal atrial fibrillation (HCC)   . Prostatitis    episodic, last episode 12/1999  . Sigmoid diverticulosis   . Tinnitus   . Vitiligo     Patient Active Problem List   Diagnosis Date Noted  . Abnormality of gait 12/08/2016  . PD (Parkinson's disease) (HCC) 10/07/2015  . Blindness, bilateral 10/07/2015    Past Surgical History:  Procedure Laterality Date  . PROSTATE SURGERY  2004   TUR       Home Medications    Prior to Admission medications   Medication Sig Start Date End Date Taking? Authorizing Provider  acetaminophen (TYLENOL) 500 MG tablet Take 500 mg by mouth as needed. Extra strength,2 tablets  Twice daily    [provider]  aspirin 81 MG tablet Take 81 mg by mouth daily.    [provider]  carbidopa-levodopa (SINEMET IR) 25-100 MG tablet Take 0.5 tablets by mouth 3 (three) times daily. 04/07/17   Huston FoleyAthar, Saima, MD  cholecalciferol (VITAMIN D) 1000 UNITS tablet Take 1,000 Units by mouth daily.    [provider]  clonazePAM (KLONOPIN) 1 MG tablet Take 1 mg by mouth daily.  11/10/14   [provider]  DIGOX 250 MCG tablet Take 5 weekly 11/08/14   [provider]  loperamide (IMODIUM) 1 MG/5ML solution Take by mouth daily.    [provider]  loratadine (CLARITIN) 10 MG tablet Take 10 mg by mouth daily as needed for allergies.    [provider]  Omega-3 Fatty Acids (FISH OIL) 1200 MG CAPS Take by mouth 2 (two) times daily.     [provider]  rosuvastatin (CRESTOR) 5 MG tablet Take 5 mg by mouth 3 (three) times a week.  01/27/16   [provider]  venlafaxine XR (EFFEXOR-XR) 75 MG 24 hr capsule Take 75 mg by mouth daily with breakfast.  11/08/14   [provider]    Family History Family History  Problem Relation Age of Onset  . Stroke Mother   . Prostate cancer Father   . Heart Problems Father   . Macular degeneration Brother     Social History Social History  Substance  Use Topics  . Smoking status: Never Smoker  . Smokeless tobacco: Never Used  . Alcohol use No     Allergies   Other; Sinemet [carbidopa w-levodopa]; and Sulfa antibiotics   Review of Systems Review of Systems All other systems are reviewed and are negative for acute change except as noted in the HPI   Physical Exam Updated Vital Signs BP 128/61 (BP Location: Left Leg)   Pulse 85   Temp 98.3 F (36.8 C)   Resp 20   Ht  (1.88 m)   Wt 80.3 kg (177 lb)   SpO2 99%   BMI 22.73 kg/m   Physical Exam  Constitutional: He is oriented to person, place, and time. He appears well-developed and well-nourished. No distress.  HENT:  Head: Normocephalic.    Right Ear: External ear normal.  Left Ear: External  ear normal.  Mouth/Throat: Oropharynx is clear and moist.  Eyes: Pupils are equal, round, and reactive to light. Conjunctivae and EOM are normal. Right eye exhibits no discharge. Left eye exhibits no discharge. No scleral icterus.  Neck: Normal range of motion. Neck supple.  Cardiovascular: Regular rhythm and normal heart sounds.  Exam reveals no gallop and no friction rub.   No murmur heard. Pulses:      Radial pulses are 2+ on the right side, and 2+ on the left side.       Dorsalis pedis pulses are 2+ on the right side, and 2+ on the left side.  Pulmonary/Chest: Effort normal and breath sounds normal. No stridor. No respiratory distress.  Abdominal: Soft. He exhibits no distension. There is no tenderness.  Musculoskeletal:       Cervical back: He exhibits no bony tenderness.       Thoracic back: He exhibits no bony tenderness.       Lumbar back: He exhibits no bony tenderness.  Clavicle stable. Chest stable to AP/Lat compression. Pelvis stable to Lat compression. No obvious extremity deformity. No chest or abdominal wall contusion.  Neurological: He is alert and oriented to person, place, and time. GCS eye subscore is 4. GCS verbal subscore is 5. GCS motor subscore is 6.  Moving all extremities   Skin: Skin is warm. He is not diaphoretic.     ED Treatments / Results  Labs (all labs ordered are listed, but only abnormal results are displayed) Labs Reviewed - No data to display  EKG  EKG Interpretation None       Radiology Ct Head Wo Contrast  Result Date: 08/15/2017 CLINICAL DATA:  Fall.  Hematoma to back of head. EXAM: CT HEAD WITHOUT CONTRAST TECHNIQUE: Contiguous axial images were obtained from the base of the skull through the vertex without intravenous contrast. COMPARISON:  None. FINDINGS: Brain: Prominence of the sulci and ventricles are noted compatible with brain atrophy. Patchy areas of low attenuation within the subcortical and periventricular white matter  identified compatible with chronic small vessel ischemic change. No evidence of acute infarction, hemorrhage, hydrocephalus, mass or mass effect. Vascular: No hyperdense vessel or unexpected calcification. Skull: Normal. Negative for fracture or focal lesion. Sinuses/Orbits: No acute finding. Other: Posterior scout laceration and hematoma noted. IMPRESSION: 1. No acute intracranial abnormality. 2. Chronic small vessel ischemic change and brain atrophy. 3. Posterior scout laceration and hematoma. Electronically Signed   By: Signa Kell M.D.   On: 08/15/2017 19:33    Procedures Procedures (including critical care time)  Medications Ordered in ED Medications - No data to display   Initial  Impression / Assessment and Plan / ED Course  I have reviewed the triage vital signs and the nursing notes.  Pertinent labs & imaging results that were available during my care of the patient were reviewed by me and considered in my medical decision making (see chart for details).     Ct head negative. The patient is safe for discharge with strict return precautions.    Final Clinical Impressions(s) / ED Diagnoses   Final diagnoses:  Fall, initial encounter  Injury of head, initial encounter   Disposition: Discharge  Condition: Good  I have discussed the results, Dx and Tx plan with the patient who expressed understanding and agree(s) with the plan. Discharge instructions discussed at great length. The patient was given strict return precautions who verbalized understanding of the instructions. No further questions at time of discharge.    Discharge Medication List as of 08/15/2017  9:03 PM      Follow Up: Nadara Eaton, MD  Schedule an appointment as soon as possible for a visit  As needed      Jezabella Schriever, Amadeo Garnet, MD 08/16/17 0001

## 2017-08-15 NOTE — ED Triage Notes (Signed)
Per EMS:  Pt fell from sitting position and hit his head.  Pt has hematoma to back of head.

## 2017-08-16 DIAGNOSIS — Z79899 Other long term (current) drug therapy: Secondary | ICD-10-CM | POA: Diagnosis not present

## 2017-08-16 DIAGNOSIS — Z7982 Long term (current) use of aspirin: Secondary | ICD-10-CM | POA: Diagnosis not present

## 2017-08-16 DIAGNOSIS — Z882 Allergy status to sulfonamides status: Secondary | ICD-10-CM | POA: Diagnosis not present

## 2017-08-16 DIAGNOSIS — I482 Chronic atrial fibrillation: Secondary | ICD-10-CM | POA: Diagnosis not present

## 2017-08-16 DIAGNOSIS — G20 Parkinson's disease: Secondary | ICD-10-CM | POA: Diagnosis not present

## 2017-08-22 DIAGNOSIS — L120 Bullous pemphigoid: Secondary | ICD-10-CM | POA: Diagnosis not present

## 2017-08-31 DIAGNOSIS — I4891 Unspecified atrial fibrillation: Secondary | ICD-10-CM | POA: Diagnosis not present

## 2017-08-31 DIAGNOSIS — F0390 Unspecified dementia without behavioral disturbance: Secondary | ICD-10-CM | POA: Diagnosis not present

## 2017-08-31 DIAGNOSIS — G20 Parkinson's disease: Secondary | ICD-10-CM | POA: Diagnosis not present

## 2017-08-31 DIAGNOSIS — R627 Adult failure to thrive: Secondary | ICD-10-CM | POA: Diagnosis not present

## 2017-10-05 DEATH — deceased

## 2018-01-22 ENCOUNTER — Ambulatory Visit (INDEPENDENT_AMBULATORY_CARE_PROVIDER_SITE_OTHER): Payer: PPO | Admitting: Ophthalmology

## 2018-08-26 IMAGING — CT CT HEAD W/O CM
3 series · 15 of 47 positions shown, 18 images · non-contrast
Comparison: None.

CLINICAL DATA: Fall.  Hematoma to back of head.

EXAM:
CT HEAD WITHOUT CONTRAST
TECHNIQUE: Contiguous axial images were obtained from the base of the skull
through the vertex without intravenous contrast.

[Series 2: head wo · axial · 0.46mm/px · z∈[-258,-118]mm · 9 of 34 slices shown, 12 images]
[im 3/34  brain]
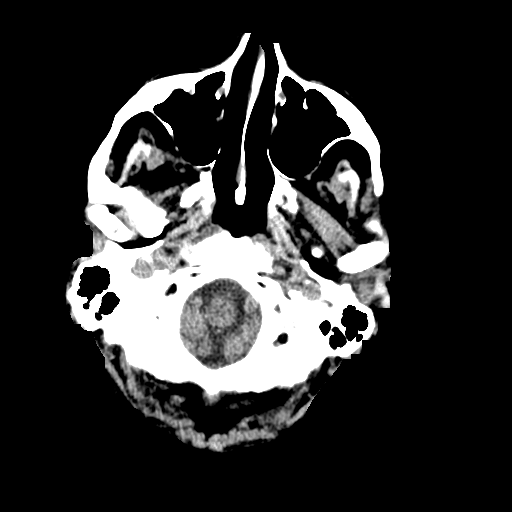
[im 3/34  bone]
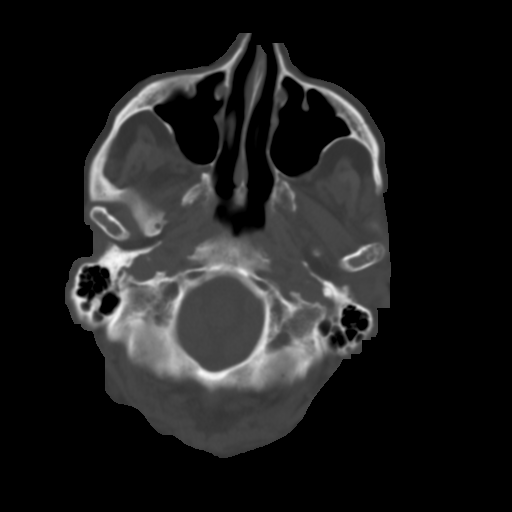
[im 6/34  brain]
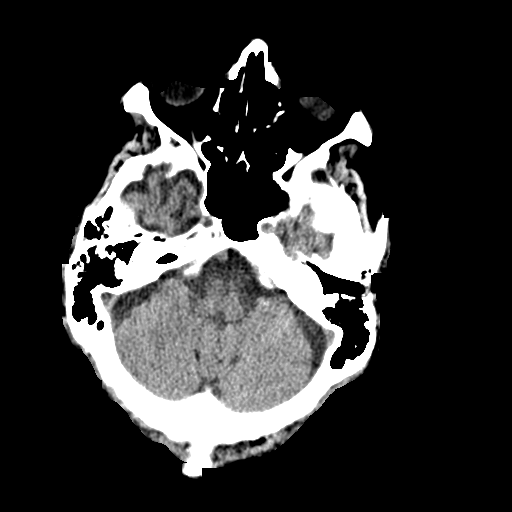
[im 10/34  brain]
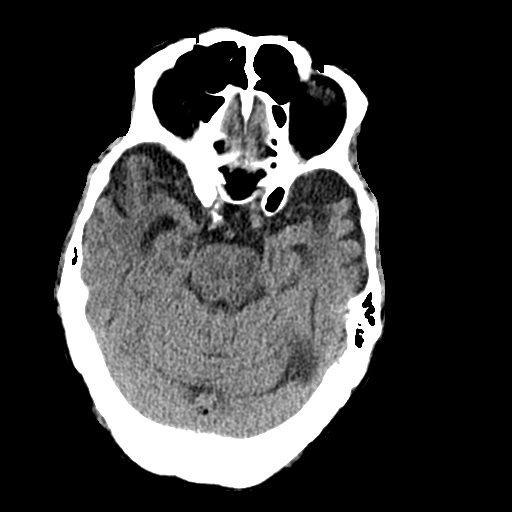
[im 13/34  brain]
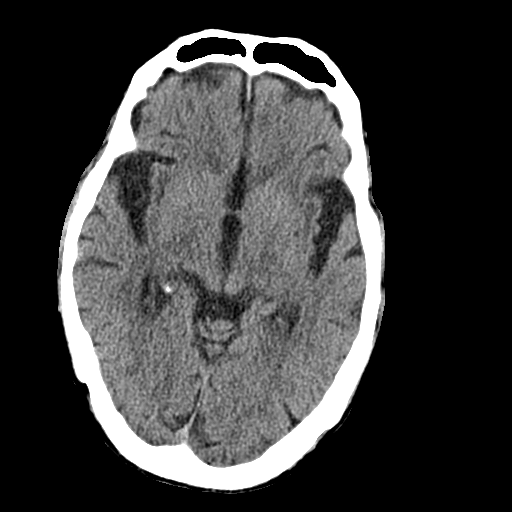
[im 18/34  brain]
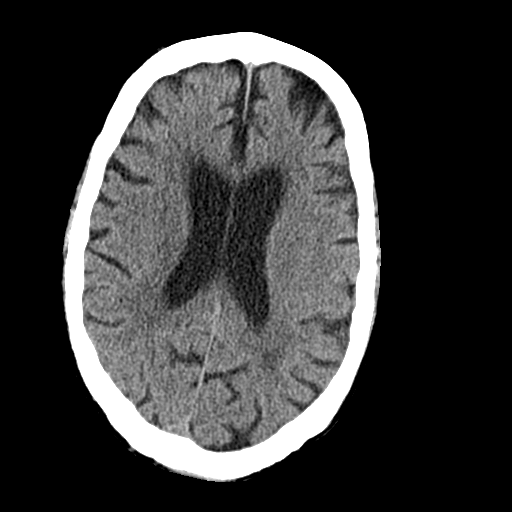
[im 18/34  bone]
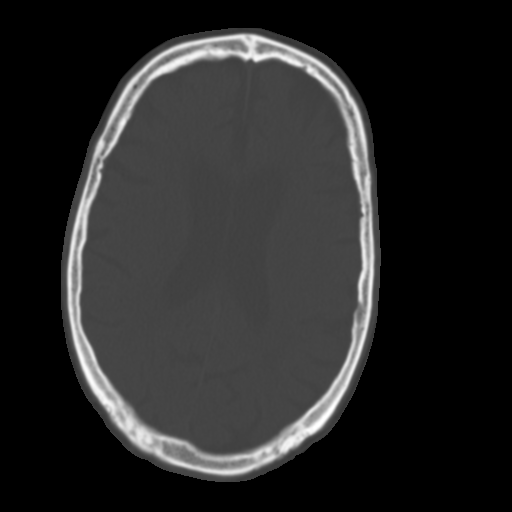
[im 21/34  brain]
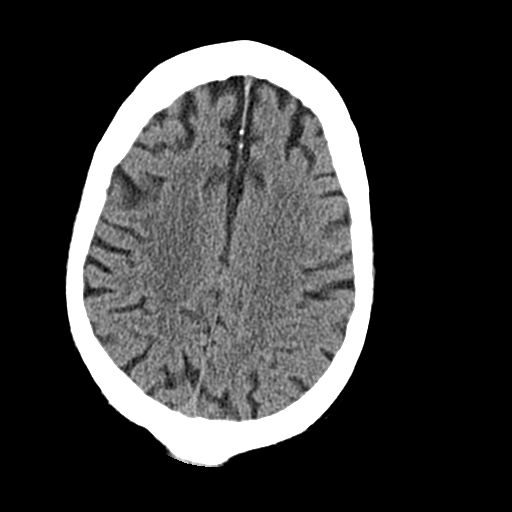
[im 24/34  brain]
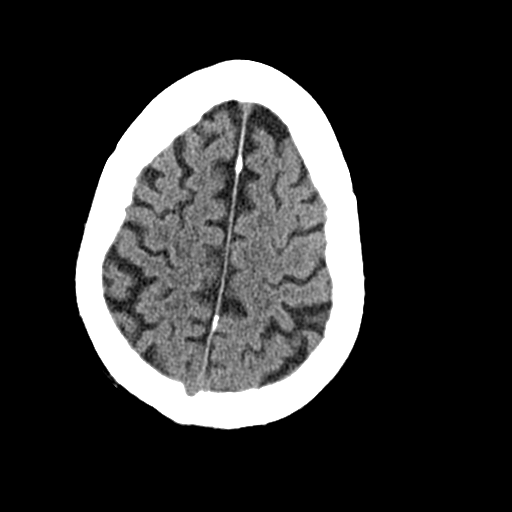
[im 28/34  brain]
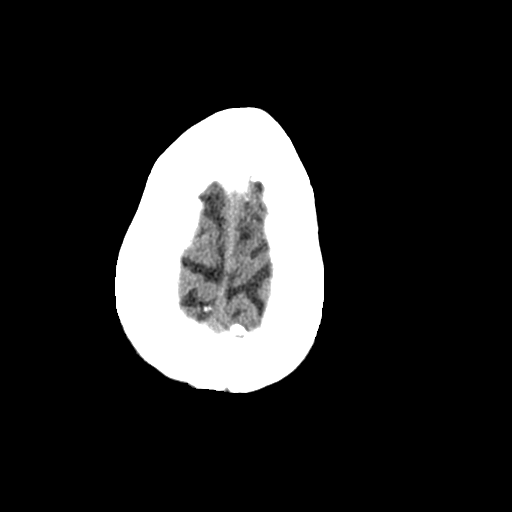
[im 31/34  brain]
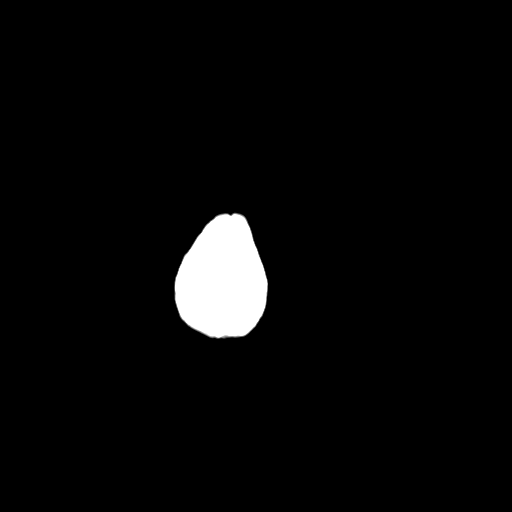
[im 31/34  bone]
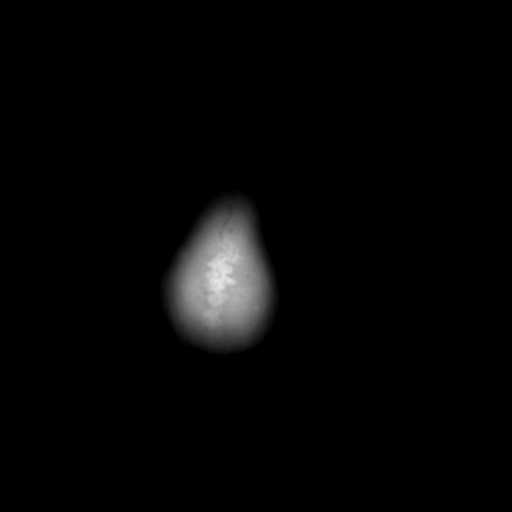

[Series 4: cor soft · coronal · 0.32mm/px · 3 of 84 slices shown]
[im 28/84  brain]
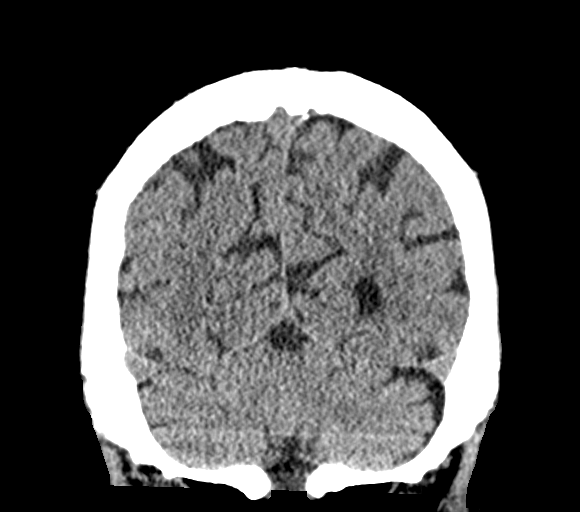
[im 37/84  brain]
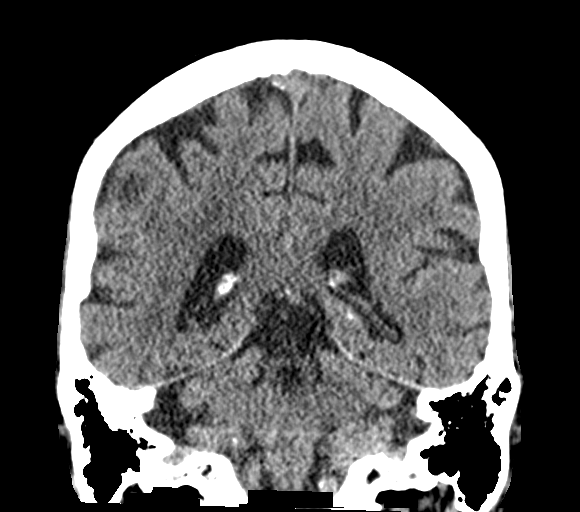
[im 47/84  brain]
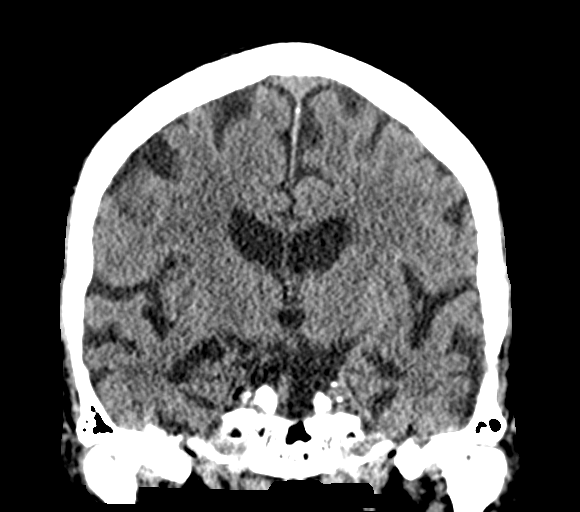

[Series 5: sag soft · sagittal · 0.32mm/px · 3 of 60 slices shown]
[im 20/60  brain]
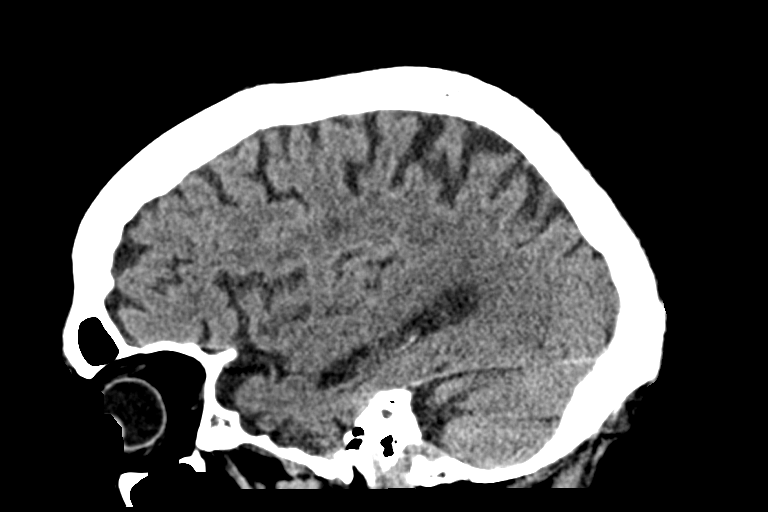
[im 30/60  brain]
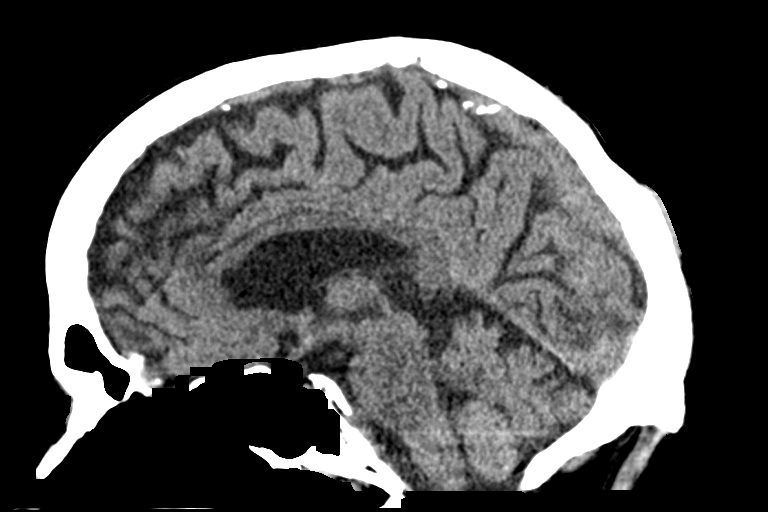
[im 40/60  brain]
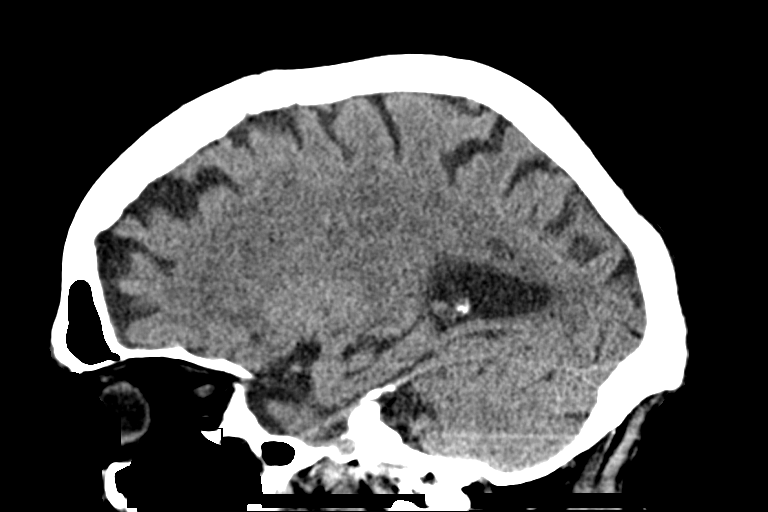

[15 of 47 positions shown; findings below may reference images not displayed]

FINDINGS: Brain: Prominence of the sulci and ventricles are noted compatible
with brain atrophy. Patchy areas of low attenuation within the
subcortical and periventricular white matter identified compatible
with chronic small vessel ischemic change. No evidence of acute
infarction, hemorrhage, hydrocephalus, mass or mass effect.

Vascular: No hyperdense vessel or unexpected calcification.

Skull: Normal. Negative for fracture or focal lesion.

Sinuses/Orbits: No acute finding.

Other: Posterior scout laceration and hematoma noted.
IMPRESSION: 1. No acute intracranial abnormality.
2. Chronic small vessel ischemic change and brain atrophy.
3. Posterior scout laceration and hematoma.
